# Patient Record
Sex: Female | Born: 1945 | Race: White | Hispanic: No | Smoking: Never smoker
Health system: Southern US, Community
[De-identification: ages and names within clinical notes are randomized; demographics above are authoritative.]

## PROBLEM LIST (undated history)

## (undated) DIAGNOSIS — M1990 Unspecified osteoarthritis, unspecified site: Secondary | ICD-10-CM

## (undated) DIAGNOSIS — E039 Hypothyroidism, unspecified: Secondary | ICD-10-CM

## (undated) DIAGNOSIS — F419 Anxiety disorder, unspecified: Secondary | ICD-10-CM

## (undated) DIAGNOSIS — E876 Hypokalemia: Secondary | ICD-10-CM

## (undated) DIAGNOSIS — F40240 Claustrophobia: Secondary | ICD-10-CM

## (undated) DIAGNOSIS — I10 Essential (primary) hypertension: Secondary | ICD-10-CM

## (undated) DIAGNOSIS — E785 Hyperlipidemia, unspecified: Secondary | ICD-10-CM

## (undated) HISTORY — PX: ABDOMINAL HYSTERECTOMY: SHX81

---

## 1975-01-20 HISTORY — PX: CYST REMOVAL TRUNK: SHX6283

## 1997-09-26 ENCOUNTER — Other Ambulatory Visit: Admission: RE | Admit: 1997-09-26 | Discharge: 1997-09-26 | Payer: Self-pay | Admitting: *Deleted

## 1998-09-12 ENCOUNTER — Other Ambulatory Visit: Admission: RE | Admit: 1998-09-12 | Discharge: 1998-09-12 | Payer: Self-pay | Admitting: *Deleted

## 2000-11-24 ENCOUNTER — Ambulatory Visit (HOSPITAL_COMMUNITY): Admission: RE | Admit: 2000-11-24 | Discharge: 2000-11-24 | Payer: Self-pay | Admitting: Gastroenterology

## 2001-10-10 ENCOUNTER — Encounter: Payer: Self-pay | Admitting: Family Medicine

## 2001-10-10 ENCOUNTER — Ambulatory Visit (HOSPITAL_COMMUNITY): Admission: RE | Admit: 2001-10-10 | Discharge: 2001-10-10 | Payer: Self-pay | Admitting: Family Medicine

## 2006-03-19 ENCOUNTER — Ambulatory Visit (HOSPITAL_COMMUNITY): Admission: RE | Admit: 2006-03-19 | Discharge: 2006-03-19 | Payer: Self-pay | Admitting: Family Medicine

## 2016-01-23 ENCOUNTER — Encounter (HOSPITAL_COMMUNITY): Payer: Self-pay

## 2016-01-23 ENCOUNTER — Encounter (HOSPITAL_COMMUNITY)
Admission: RE | Admit: 2016-01-23 | Discharge: 2016-01-23 | Disposition: A | Discharge status: Home / Self Care | Payer: Medicare HMO | Source: Ambulatory Visit | Attending: Ophthalmology | Admitting: Ophthalmology

## 2016-01-23 DIAGNOSIS — Z01818 Encounter for other preprocedural examination: Secondary | ICD-10-CM | POA: Diagnosis not present

## 2016-01-23 HISTORY — DX: Unspecified osteoarthritis, unspecified site: M19.90

## 2016-01-23 HISTORY — DX: Claustrophobia: F40.240

## 2016-01-23 HISTORY — DX: Hypothyroidism, unspecified: E03.9

## 2016-01-23 HISTORY — DX: Anxiety disorder, unspecified: F41.9

## 2016-01-23 HISTORY — DX: Essential (primary) hypertension: I10

## 2016-01-23 LAB — BASIC METABOLIC PANEL
ANION GAP: 11 (ref 5–15)
BUN: 23 mg/dL — ABNORMAL HIGH (ref 6–20)
CALCIUM: 9.3 mg/dL (ref 8.9–10.3)
CO2: 28 mmol/L (ref 22–32)
Chloride: 97 mmol/L — ABNORMAL LOW (ref 101–111)
Creatinine, Ser: 1.15 mg/dL — ABNORMAL HIGH (ref 0.44–1.00)
GFR calc non Af Amer: 47 mL/min — ABNORMAL LOW (ref >60)
GFR, EST AFRICAN AMERICAN: 54 mL/min — AB (ref >60)
GLUCOSE: 113 mg/dL — AB (ref 65–99)
POTASSIUM: 3.5 mmol/L (ref 3.5–5.1)
Sodium: 136 mmol/L (ref 135–145)

## 2016-01-23 LAB — CBC
HEMATOCRIT: 39.7 % (ref 36.0–46.0)
HEMOGLOBIN: 13.6 g/dL (ref 12.0–15.0)
MCH: 32.2 pg (ref 26.0–34.0)
MCHC: 34.3 g/dL (ref 30.0–36.0)
MCV: 93.9 fL (ref 78.0–100.0)
Platelets: 197 10*3/uL (ref 150–400)
RBC: 4.23 MIL/uL (ref 3.87–5.11)
RDW: 13.1 % (ref 11.5–15.5)
WBC: 5.5 10*3/uL (ref 4.0–10.5)

## 2016-01-23 NOTE — Patient Instructions (Signed)
Your procedure is scheduled on: 01/28/2016  Report to [facility] at   700  AM.  Call this number if you have problems the morning of surgery: [phone]   Do not eat food or drink liquids :After Midnight.      Take these medicines the morning of surgery with A SIP OF WATER: cartia XT, gabapentin, levothyroxine.   Do not wear jewelry, make-up or nail polish.  Do not wear lotions, powders, or perfumes. You may wear deodorant.  Do not shave 48 hours prior to surgery.  Do not bring valuables to the hospital.  Contacts, dentures or bridgework may not be worn into surgery.  Leave suitcase in the car. After surgery it may be brought to your room.  For patients admitted to the hospital, checkout time is 11:00 AM the day of discharge.   Patients discharged the day of surgery will not be allowed to drive home.  :     Please read over the following fact sheets that you were given: Coughing and Deep Breathing, Surgical Site Infection Prevention, Anesthesia Post-op Instructions and Care and Recovery After Surgery    Cataract A cataract is a clouding of the lens of the eye. When a lens becomes cloudy, vision is reduced based on the degree and nature of the clouding. Many cataracts reduce vision to some degree. Some cataracts make people more near-sighted as they develop. Other cataracts increase glare. Cataracts that are ignored and become worse can sometimes look white. The white color can be seen through the pupil. CAUSES   Aging. However, cataracts may occur at any age, even in newborns.   Certain drugs.   Trauma to the eye.   Certain diseases such as diabetes.   Specific eye diseases such as chronic inflammation inside the eye or a sudden attack of a rare form of glaucoma.   Inherited or acquired medical problems.  SYMPTOMS   Gradual, progressive drop in vision in the affected eye.   Severe, rapid visual loss. This most often happens when trauma is the cause.  DIAGNOSIS  To detect a  cataract, an eye doctor examines the lens. Cataracts are best diagnosed with an exam of the eyes with the pupils enlarged (dilated) by drops.  TREATMENT  For an early cataract, vision may improve by using different eyeglasses or stronger lighting. If that does not help your vision, surgery is the only effective treatment. A cataract needs to be surgically removed when vision loss interferes with your everyday activities, such as driving, reading, or watching TV. A cataract may also have to be removed if it prevents examination or treatment of another eye problem. Surgery removes the cloudy lens and usually replaces it with a substitute lens (intraocular lens, IOL).  At a time when both you and your doctor agree, the cataract will be surgically removed. If you have cataracts in both eyes, only one is usually removed at a time. This allows the operated eye to heal and be out of danger from any possible problems after surgery (such as infection or poor wound healing). In rare cases, a cataract may be doing damage to your eye. In these cases, your caregiver may advise surgical removal right away. The vast majority of people who have cataract surgery have better vision afterward. HOME CARE INSTRUCTIONS  If you are not planning surgery, you may be asked to do the following:  Use different eyeglasses.   Use stronger or brighter lighting.   Ask your eye doctor about reducing  your medicine dose or changing medicines if it is thought that a medicine caused your cataract. Changing medicines does not make the cataract go away on its own.   Become familiar with your surroundings. Poor vision can lead to injury. Avoid bumping into things on the affected side. You are at a higher risk for tripping or falling.   Exercise extreme care when driving or operating machinery.   Wear sunglasses if you are sensitive to bright light or experiencing problems with glare.  SEEK IMMEDIATE MEDICAL CARE IF:   You have a  worsening or sudden vision loss.   You notice redness, swelling, or increasing pain in the eye.   You have a fever.  Document Released: 01/05/2005 Document Revised: 12/25/2010 Document Reviewed: 08/29/2010 [facility] Patient Information 2012 [place].PATIENT INSTRUCTIONS POST-ANESTHESIA  IMMEDIATELY FOLLOWING SURGERY:  Do not drive or operate machinery for the first twenty four hours after surgery.  Do not make any important decisions for twenty four hours after surgery or while taking narcotic pain medications or sedatives.  If you develop intractable nausea and vomiting or a severe headache please notify your doctor immediately.  FOLLOW-UP:  Please make an appointment with your surgeon as instructed. You do not need to follow up with anesthesia unless specifically instructed to do so.  WOUND CARE INSTRUCTIONS (if applicable):  Keep a dry clean dressing on the anesthesia/puncture wound site if there is drainage.  Once the wound has quit draining you may leave it open to air.  Generally you should leave the bandage intact for twenty four hours unless there is drainage.  If the epidural site drains for more than 36-48 hours please call the anesthesia department.  QUESTIONS?:  Please feel free to call your physician or the hospital operator if you have any questions, and they will be happy to assist you.

## 2016-02-05 MED ORDER — PHENYLEPHRINE HCL 2.5 % OP SOLN: 1.0000 [drp] | OPHTHALMIC | Status: AC

## 2016-02-05 MED ORDER — CYCLOPENTOLATE-PHENYLEPHRINE 0.2-1 % OP SOLN: 1.0000 [drp] | OPHTHALMIC | Status: AC

## 2016-02-05 MED ORDER — TETRACAINE HCL 0.5 % OP SOLN: 1.0000 [drp] | OPHTHALMIC | Status: AC

## 2016-02-05 MED ORDER — KETOROLAC TROMETHAMINE 0.5 % OP SOLN: 1.0000 [drp] | OPHTHALMIC | Status: AC

## 2016-02-06 ENCOUNTER — Inpatient Hospital Stay (HOSPITAL_COMMUNITY): Admission: RE | Admit: 2016-02-06 | Payer: Self-pay | Source: Ambulatory Visit

## 2016-02-06 ENCOUNTER — Encounter (HOSPITAL_COMMUNITY)
Admission: RE | Admit: 2016-02-06 | Discharge: 2016-02-06 | Disposition: A | Discharge status: Home / Self Care | Payer: Medicare HMO | Source: Ambulatory Visit | Attending: Ophthalmology | Admitting: Ophthalmology

## 2016-02-06 ENCOUNTER — Encounter (HOSPITAL_COMMUNITY): Payer: Self-pay

## 2016-02-06 HISTORY — DX: Hyperlipidemia, unspecified: E78.5

## 2016-02-06 HISTORY — DX: Hypokalemia: E87.6

## 2016-02-11 ENCOUNTER — Ambulatory Visit (HOSPITAL_COMMUNITY)
Admission: RE | Admit: 2016-02-11 | Discharge: 2016-02-11 | Disposition: A | Discharge status: Home / Self Care | Payer: Medicare HMO | Source: Ambulatory Visit | Attending: Ophthalmology | Admitting: Ophthalmology

## 2016-02-11 ENCOUNTER — Encounter (HOSPITAL_COMMUNITY)
Admission: RE | Disposition: A | Discharge status: Home / Self Care | Payer: Self-pay | Source: Ambulatory Visit | Attending: Ophthalmology

## 2016-02-11 ENCOUNTER — Ambulatory Visit (HOSPITAL_COMMUNITY): Payer: Medicare HMO | Admitting: Anesthesiology

## 2016-02-11 DIAGNOSIS — Z79899 Other long term (current) drug therapy: Secondary | ICD-10-CM | POA: Diagnosis not present

## 2016-02-11 DIAGNOSIS — H269 Unspecified cataract: Secondary | ICD-10-CM | POA: Diagnosis not present

## 2016-02-11 DIAGNOSIS — I10 Essential (primary) hypertension: Secondary | ICD-10-CM | POA: Diagnosis not present

## 2016-02-11 DIAGNOSIS — E039 Hypothyroidism, unspecified: Secondary | ICD-10-CM | POA: Insufficient documentation

## 2016-02-11 DIAGNOSIS — Z7982 Long term (current) use of aspirin: Secondary | ICD-10-CM | POA: Diagnosis not present

## 2016-02-11 HISTORY — PX: CATARACT EXTRACTION W/PHACO: SHX586

## 2016-02-11 SURGERY — PHACOEMULSIFICATION, CATARACT, WITH IOL INSERTION
Anesthesia: Monitor Anesthesia Care | Site: Eye | Laterality: Left

## 2016-02-11 MED ORDER — KETOROLAC TROMETHAMINE 0.5 % OP SOLN
1.0000 [drp] | OPHTHALMIC | Status: AC
  Administered 2016-02-11 (×3): 1 [drp] via OPHTHALMIC

## 2016-02-11 MED ORDER — EPINEPHRINE PF 1 MG/ML IJ SOLN
INTRAMUSCULAR | Status: AC
  Filled 2016-02-11: qty 1

## 2016-02-11 MED ORDER — BSS IO SOLN
INTRAOCULAR | Status: DC | PRN
  Administered 2016-02-11: 15 mL

## 2016-02-11 MED ORDER — MIDAZOLAM HCL 2 MG/2ML IJ SOLN
0.5000 mg | INTRAMUSCULAR | Status: DC | PRN
  Administered 2016-02-11: 2 mg via INTRAVENOUS

## 2016-02-11 MED ORDER — PROVISC 10 MG/ML IO SOLN
INTRAOCULAR | Status: DC | PRN
  Administered 2016-02-11: 0.85 mL via INTRAOCULAR

## 2016-02-11 MED ORDER — PHENYLEPHRINE HCL 2.5 % OP SOLN
1.0000 [drp] | OPHTHALMIC | Status: AC
  Administered 2016-02-11 (×3): 1 [drp] via OPHTHALMIC

## 2016-02-11 MED ORDER — EPINEPHRINE PF 1 MG/ML IJ SOLN
INTRAOCULAR | Status: DC | PRN
  Administered 2016-02-11: 500 mL

## 2016-02-11 MED ORDER — LACTATED RINGERS IV SOLN
INTRAVENOUS | Status: DC
  Administered 2016-02-11: 1000 mL via INTRAVENOUS

## 2016-02-11 MED ORDER — FENTANYL CITRATE (PF) 100 MCG/2ML IJ SOLN
INTRAMUSCULAR | Status: AC
  Filled 2016-02-11: qty 2

## 2016-02-11 MED ORDER — TETRACAINE HCL 0.5 % OP SOLN
1.0000 [drp] | OPHTHALMIC | Status: AC
  Administered 2016-02-11 (×3): 1 [drp] via OPHTHALMIC

## 2016-02-11 MED ORDER — CYCLOPENTOLATE-PHENYLEPHRINE 0.2-1 % OP SOLN
1.0000 [drp] | OPHTHALMIC | Status: AC
  Administered 2016-02-11 (×3): 1 [drp] via OPHTHALMIC

## 2016-02-11 MED ORDER — MIDAZOLAM HCL 2 MG/2ML IJ SOLN
INTRAMUSCULAR | Status: AC
  Filled 2016-02-11: qty 2

## 2016-02-11 MED ORDER — FENTANYL CITRATE (PF) 100 MCG/2ML IJ SOLN
25.0000 ug | INTRAMUSCULAR | Status: AC | PRN
  Administered 2016-02-11 (×2): 25 ug via INTRAVENOUS

## 2016-02-11 MED ORDER — TETRACAINE 0.5 % OP SOLN OPTIME - NO CHARGE
OPHTHALMIC | Status: DC | PRN
  Administered 2016-02-11: 2 [drp] via OPHTHALMIC

## 2016-02-11 MED ORDER — MIDAZOLAM HCL 2 MG/2ML IJ SOLN
INTRAMUSCULAR | Status: DC | PRN
  Administered 2016-02-11 (×2): 1 mg via INTRAVENOUS

## 2016-02-11 SURGICAL SUPPLY — 10 items
CLOTH BEACON ORANGE TIMEOUT ST (SAFETY) ×2 IMPLANT
EYE SHIELD UNIVERSAL CLEAR (GAUZE/BANDAGES/DRESSINGS) ×2 IMPLANT
GLOVE BIO SURGEON STRL SZ 6.5 (GLOVE) ×2 IMPLANT
GLOVE BIO SURGEONS STRL SZ 6.5 (GLOVE) ×1
GLOVE EXAM NITRILE MD LF STRL (GLOVE) ×2 IMPLANT
LENS ALC ACRYL/TECN (Ophthalmic Related) ×3 IMPLANT
PAD ARMBOARD 7.5X6 YLW CONV (MISCELLANEOUS) ×2 IMPLANT
TAPE SURG TRANSPORE 1 IN (GAUZE/BANDAGES/DRESSINGS) ×1 IMPLANT
TAPE SURGICAL TRANSPORE 1 IN (GAUZE/BANDAGES/DRESSINGS) ×2
WATER STERILE IRR 250ML POUR (IV SOLUTION) ×2 IMPLANT

## 2016-02-11 NOTE — Anesthesia Preprocedure Evaluation (Signed)
Anesthesia Evaluation  Patient identified by MRN, date of birth, ID band Patient awake    Reviewed: Allergy & Precautions, NPO status , Patient's Chart, lab work & pertinent test results  Airway Mallampati: III  TM Distance: >3 FB     Dental  (+) Teeth Intact   Pulmonary neg pulmonary ROS,    breath sounds clear to auscultation       Cardiovascular hypertension, Pt. on medications  Rhythm:Regular Rate:Normal     Neuro/Psych    GI/Hepatic negative GI ROS,   Endo/Other  Hypothyroidism   Renal/GU      Musculoskeletal   Abdominal   Peds  Hematology   Anesthesia Other Findings   Reproductive/Obstetrics                             Anesthesia Physical Anesthesia Plan  ASA: II  Anesthesia Plan: MAC   Post-op Pain Management:    Induction: Intravenous  Airway Management Planned: Nasal Cannula  Additional Equipment:   Intra-op Plan:   Post-operative Plan:   Informed Consent: I have reviewed the patients History and Physical, chart, labs and discussed the procedure including the risks, benefits and alternatives for the proposed anesthesia with the patient or authorized representative who has indicated his/her understanding and acceptance.     Plan Discussed with:   Anesthesia Plan Comments:         Anesthesia Quick Evaluation

## 2016-02-11 NOTE — H&P (Signed)
The patient was re examined and there is no change in the patients condition since the original H and P. 

## 2016-02-11 NOTE — Anesthesia Procedure Notes (Signed)
Procedure Name: MAC Date/Time: 02/11/2016 9:05 AM Performed by: Vista Deck Pre-anesthesia Checklist: Patient identified, Emergency Drugs available, Suction available, Timeout performed and Patient being monitored Patient Re-evaluated:Patient Re-evaluated prior to inductionOxygen Delivery Method: Nasal Cannula

## 2016-02-11 NOTE — Transfer of Care (Signed)
Immediate Anesthesia Transfer of Care Note  Patient: Amber Randall  Procedure(s) Performed: Procedure(s) (LRB): CATARACT EXTRACTION PHACO AND INTRAOCULAR LENS PLACEMENT (IOC) (Left)  Patient Location: Shortstay  Anesthesia Type: MAC  Level of Consciousness: awake  Airway & Oxygen Therapy: Patient Spontanous Breathing   Post-op Assessment: Report given to PACU RN, Post -op Vital signs reviewed and stable and Patient moving all extremities  Post vital signs: Reviewed and stable  Complications: No apparent anesthesia complications

## 2016-02-11 NOTE — Anesthesia Postprocedure Evaluation (Signed)
Anesthesia Post Note  Patient: Amber Randall  Procedure(s) Performed: Procedure(s) (LRB): CATARACT EXTRACTION PHACO AND INTRAOCULAR LENS PLACEMENT (IOC) (Left)  Anesthesia Type: MAC     Last Vitals:  Vitals:   02/11/16 0850 02/11/16 0855  BP: (!) 103/58 (!) 104/57  Pulse:    Resp: 11 11  Temp:      Last Pain:  Vitals:   02/11/16 0755  TempSrc: Oral                 Anesthesia Post-op Note  Patient: [name]  Procedure(s) Performed: Procedure(s) (LRB): CATARACT EXTRACTION PHACO AND INTRAOCULAR LENS PLACEMENT (IOC) (Left)  Patient Location:  Short Stay  Anesthesia Type: MAC  Level of Consciousness: awake  Airway and Oxygen Therapy: Patient Spontanous Breathing  Post-op Pain: none  Post-op Assessment: Post-op Vital signs reviewed, Patient's Cardiovascular Status Stable, Respiratory Function Stable, Patent Airway, No signs of Nausea or vomiting and Pain level controlled  Post-op Vital Signs: Reviewed and stable  Complications: No apparent anesthesia complications  Charissa Knowles

## 2016-02-11 NOTE — Op Note (Signed)
Patient brought to the operating room and prepped and draped in the usual manner.  Lid speculum inserted in left eye.  Stab incision made at the twelve o'clock position.  Provisc instilled in the anterior chamber.   A 2.4 mm. Stab incision was made temporally.  An anterior capsulotomy was done with a bent 25 gauge needle.  The nucleus was hydrodissected.  The Phaco tip was inserted in the anterior chamber and the nucleus was emulsified.  CDE was 6.00.  The cortical material was then removed with the I and A tip.  Posterior capsule was the polished.  The anterior chamber was deepened with Provisc.  A 23.5 Diopter Alcon AU00T0 IOL was then inserted in the capsular bag.  Provisc was then removed with the I and A tip.  The wound was then hydrated.  Patient sent to the Recovery Room in good condition with follow up in my office.  Preoperative Diagnosis:  Nuclear Cataract OS Postoperative Diagnosis:  Same Procedure name: Kelman Phacoemulsification OS with IOL

## 2016-02-11 NOTE — Discharge Instructions (Signed)
°  °        [facility] Instructions °[address]- [place] [address] °    ° °1. Avoid closing eyes tightly. One often closes the eye tightly when laughing, talking, sneezing, coughing or if they feel irritated. At these times, you should be careful not to close your eyes tightly. ° °2. Instill eye drops as instructed. To instill drops in your eye, open it, look up and have someone gently pull the lower lid down and instill a couple of drops inside the lower lid. ° °3. Do not touch upper lid. ° °4. Take Advil or Tylenol for pain. ° °5. You may use either eye for near work, such as reading or sewing and you may watch television. ° °6. You may have your hair done at the beauty parlor at any time. ° °7. Wear dark glasses with or without your own glasses if you are in bright light. ° °8. Call our office at [phone] or [phone] if you have sharp pain in your eye or unusual symptoms. ° °9.  FOLLOW UP WITH DR. SHAPIRO TODAY IN HIS [place] OFFICE AT 2:45pm. ° °  °I have received a copy of the above instructions and will follow them.  ° ° ° °IF YOU ARE IN IMMEDIATE DANGER CALL 911! ° °It is important for you to keep your follow-up appointment with your physician after discharge, OR, for you /your caregiver to make a follow-up appointment with your physician / medical provider after discharge. ° °Show these instructions to the next healthcare provider you see. ° ° °Moderate Conscious Sedation, Adult, Care After °These instructions provide you with information about caring for yourself after your procedure. Your health care provider may also give you more specific instructions. Your treatment has been planned according to current medical practices, but problems sometimes occur. Call your health care provider if you have any problems or questions after your procedure. °What can I expect after the procedure? °After your procedure, it is common: °· To feel sleepy for several  hours. °· To feel clumsy and have poor balance for several hours. °· To have poor judgment for several hours. °· To vomit if you eat too soon. °Follow these instructions at home: °For at least 24 hours after the procedure:  ° °· Do not: °¨ Participate in activities where you could fall or become injured. °¨ Drive. °¨ Use heavy machinery. °¨ Drink alcohol. °¨ Take sleeping pills or medicines that cause drowsiness. °¨ Make important decisions or sign legal documents. °¨ Take care of children on your own. °· Rest. °Eating and drinking  °· Follow the diet recommended by your health care provider. °· If you vomit: °¨ Drink water, juice, or soup when you can drink without vomiting. °¨ Make sure you have little or no nausea before eating solid foods. °General instructions  °· Have a responsible adult stay with you until you are awake and alert. °· Take over-the-counter and prescription medicines only as told by your health care provider. °· If you smoke, do not smoke without supervision. °· Keep all follow-up visits as told by your health care provider. This is important. °Contact a health care provider if: °· You keep feeling nauseous or you keep vomiting. °· You feel light-headed. °· You develop a rash. °· You have a fever. °Get help right away if: °· You have trouble breathing. °This information is not intended to replace advice given to you by your health care provider. Make   any questions you have with your health care provider. °Document Released: 10/26/2012 Document Revised: 06/10/2015 Document Reviewed: 04/27/2015 °Elsevier Interactive Patient Education © 2017 [employer] ° ° °

## 2016-02-12 ENCOUNTER — Encounter (HOSPITAL_COMMUNITY): Payer: Self-pay | Admitting: Ophthalmology

## 2016-02-28 ENCOUNTER — Encounter (HOSPITAL_COMMUNITY): Payer: Self-pay

## 2016-02-28 ENCOUNTER — Encounter (HOSPITAL_COMMUNITY)
Admission: RE | Admit: 2016-02-28 | Discharge: 2016-02-28 | Disposition: A | Discharge status: Home / Self Care | Payer: Medicare HMO | Source: Ambulatory Visit | Attending: Ophthalmology | Admitting: Ophthalmology

## 2016-03-03 ENCOUNTER — Ambulatory Visit (HOSPITAL_COMMUNITY)
Admission: RE | Admit: 2016-03-03 | Discharge: 2016-03-03 | Disposition: A | Discharge status: Home / Self Care | Payer: Medicare HMO | Source: Ambulatory Visit | Attending: Ophthalmology | Admitting: Ophthalmology

## 2016-03-03 ENCOUNTER — Ambulatory Visit (HOSPITAL_COMMUNITY): Payer: Medicare HMO | Admitting: Anesthesiology

## 2016-03-03 ENCOUNTER — Encounter (HOSPITAL_COMMUNITY)
Admission: RE | Disposition: A | Discharge status: Home / Self Care | Payer: Self-pay | Source: Ambulatory Visit | Attending: Ophthalmology

## 2016-03-03 ENCOUNTER — Encounter (HOSPITAL_COMMUNITY): Payer: Self-pay | Admitting: Anesthesiology

## 2016-03-03 DIAGNOSIS — E039 Hypothyroidism, unspecified: Secondary | ICD-10-CM | POA: Insufficient documentation

## 2016-03-03 DIAGNOSIS — H2511 Age-related nuclear cataract, right eye: Secondary | ICD-10-CM | POA: Insufficient documentation

## 2016-03-03 DIAGNOSIS — Z79899 Other long term (current) drug therapy: Secondary | ICD-10-CM | POA: Insufficient documentation

## 2016-03-03 DIAGNOSIS — I10 Essential (primary) hypertension: Secondary | ICD-10-CM | POA: Diagnosis not present

## 2016-03-03 HISTORY — PX: CATARACT EXTRACTION W/PHACO: SHX586

## 2016-03-03 SURGERY — PHACOEMULSIFICATION, CATARACT, WITH IOL INSERTION
Anesthesia: Monitor Anesthesia Care | Site: Eye | Laterality: Right

## 2016-03-03 MED ORDER — BSS IO SOLN
INTRAOCULAR | Status: DC | PRN
  Administered 2016-03-03: 15 mL

## 2016-03-03 MED ORDER — FENTANYL CITRATE (PF) 100 MCG/2ML IJ SOLN
25.0000 ug | INTRAMUSCULAR | Status: AC
  Administered 2016-03-03: 25 ug via INTRAVENOUS

## 2016-03-03 MED ORDER — PROVISC 10 MG/ML IO SOLN
INTRAOCULAR | Status: DC | PRN
  Administered 2016-03-03: 0.85 mL via INTRAOCULAR

## 2016-03-03 MED ORDER — MIDAZOLAM HCL 2 MG/2ML IJ SOLN
INTRAMUSCULAR | Status: AC
  Filled 2016-03-03: qty 2

## 2016-03-03 MED ORDER — TETRACAINE HCL 0.5 % OP SOLN
1.0000 [drp] | OPHTHALMIC | Status: AC
  Administered 2016-03-03 (×3): 1 [drp] via OPHTHALMIC

## 2016-03-03 MED ORDER — KETOROLAC TROMETHAMINE 0.5 % OP SOLN
1.0000 [drp] | OPHTHALMIC | Status: AC
Start: 2016-03-03 — End: 2016-03-03
  Administered 2016-03-03 (×3): 1 [drp] via OPHTHALMIC

## 2016-03-03 MED ORDER — MIDAZOLAM HCL 2 MG/2ML IJ SOLN
1.0000 mg | INTRAMUSCULAR | Status: AC
  Administered 2016-03-03: 2 mg via INTRAVENOUS

## 2016-03-03 MED ORDER — EPINEPHRINE PF 1 MG/ML IJ SOLN
INTRAMUSCULAR | Status: AC
  Filled 2016-03-03: qty 1

## 2016-03-03 MED ORDER — TETRACAINE 0.5 % OP SOLN OPTIME - NO CHARGE
OPHTHALMIC | Status: DC | PRN
  Administered 2016-03-03: 2 [drp] via OPHTHALMIC

## 2016-03-03 MED ORDER — PHENYLEPHRINE HCL 2.5 % OP SOLN
1.0000 [drp] | OPHTHALMIC | Status: AC
  Administered 2016-03-03 (×3): 1 [drp] via OPHTHALMIC

## 2016-03-03 MED ORDER — CYCLOPENTOLATE-PHENYLEPHRINE 0.2-1 % OP SOLN
1.0000 [drp] | OPHTHALMIC | Status: AC
  Administered 2016-03-03 (×2): 1 [drp] via OPHTHALMIC

## 2016-03-03 MED ORDER — LACTATED RINGERS IV SOLN
INTRAVENOUS | Status: DC
  Administered 2016-03-03: 10:00:00 via INTRAVENOUS

## 2016-03-03 MED ORDER — EPINEPHRINE PF 1 MG/ML IJ SOLN
INTRAMUSCULAR | Status: DC | PRN
  Administered 2016-03-03: 500 mL

## 2016-03-03 MED ORDER — FENTANYL CITRATE (PF) 100 MCG/2ML IJ SOLN
INTRAMUSCULAR | Status: AC
  Filled 2016-03-03: qty 2

## 2016-03-03 SURGICAL SUPPLY — 10 items
CLOTH BEACON ORANGE TIMEOUT ST (SAFETY) ×1 IMPLANT
EYE SHIELD UNIVERSAL CLEAR (GAUZE/BANDAGES/DRESSINGS) ×1 IMPLANT
GLOVE BIO SURGEON STRL SZ 6.5 (GLOVE) ×1 IMPLANT
GLOVE BIOGEL PI IND STRL 7.0 (GLOVE) IMPLANT
GLOVE BIOGEL PI INDICATOR 7.0 (GLOVE) ×1
LENS ALC ACRYL/TECN (Ophthalmic Related) ×2 IMPLANT
PAD ARMBOARD 7.5X6 YLW CONV (MISCELLANEOUS) ×2 IMPLANT
TAPE SURG TRANSPORE 1 IN (GAUZE/BANDAGES/DRESSINGS) IMPLANT
TAPE SURGICAL TRANSPORE 1 IN (GAUZE/BANDAGES/DRESSINGS) ×1
WATER STERILE IRR 250ML POUR (IV SOLUTION) ×1 IMPLANT

## 2016-03-03 NOTE — Anesthesia Preprocedure Evaluation (Signed)
Anesthesia Evaluation  Patient identified by MRN, date of birth, ID band Patient awake    Reviewed: Allergy & Precautions, NPO status , Patient's Chart, lab work & pertinent test results  Airway Mallampati: III  TM Distance: >3 FB     Dental  (+) Teeth Intact   Pulmonary neg pulmonary ROS,    breath sounds clear to auscultation       Cardiovascular hypertension, Pt. on medications  Rhythm:Regular Rate:Normal     Neuro/Psych    GI/Hepatic negative GI ROS,   Endo/Other  Hypothyroidism   Renal/GU      Musculoskeletal   Abdominal   Peds  Hematology   Anesthesia Other Findings   Reproductive/Obstetrics                             Anesthesia Physical Anesthesia Plan  ASA: II  Anesthesia Plan: MAC   Post-op Pain Management:    Induction: Intravenous  Airway Management Planned: Nasal Cannula  Additional Equipment:   Intra-op Plan:   Post-operative Plan:   Informed Consent: I have reviewed the patients History and Physical, chart, labs and discussed the procedure including the risks, benefits and alternatives for the proposed anesthesia with the patient or authorized representative who has indicated his/her understanding and acceptance.     Plan Discussed with:   Anesthesia Plan Comments:         Anesthesia Quick Evaluation  

## 2016-03-03 NOTE — Anesthesia Procedure Notes (Signed)
Procedure Name: MAC Date/Time: 03/03/2016 10:02 AM Performed by: Andree Elk, AMY A Pre-anesthesia Checklist: Patient identified, Timeout performed, Emergency Drugs available and Suction available Oxygen Delivery Method: Simple face mask

## 2016-03-03 NOTE — Transfer of Care (Signed)
Immediate Anesthesia Transfer of Care Note  Patient: Amber Randall  Procedure(s) Performed: Procedure(s) with comments: CATARACT EXTRACTION PHACO AND INTRAOCULAR LENS PLACEMENT (IOC) (Right) - CDE: 7.67  Patient Location: PACU  Anesthesia Type:MAC  Level of Consciousness: awake, alert , oriented and patient cooperative  Airway & Oxygen Therapy: Patient Spontanous Breathing  Post-op Assessment: Report given to RN and Post -op Vital signs reviewed and stable  Post vital signs: Reviewed and stable  Last Vitals:  Vitals:   03/03/16 0955 03/03/16 0956  BP:  110/74  Pulse:    Resp: 18 (!) 24  Temp:      Last Pain:  Vitals:   03/03/16 0851  TempSrc: Oral      Patients Stated Pain Goal: 4 (67/89/38 1017)  Complications: No apparent anesthesia complications

## 2016-03-03 NOTE — H&P (Signed)
The patient was re examined and there is no change in the patients condition since the original H and P. 

## 2016-03-03 NOTE — Discharge Instructions (Addendum)
PATIENT INSTRUCTIONS °POST-ANESTHESIA ° °IMMEDIATELY FOLLOWING SURGERY:  Do not drive or operate machinery for the first twenty four hours after surgery.  Do not make any important decisions for twenty four hours after surgery or while taking narcotic pain medications or sedatives.  If you develop intractable nausea and vomiting or a severe headache please notify your doctor immediately. ° °FOLLOW-UP:  Please make an appointment with your surgeon as instructed. You do not need to follow up with anesthesia unless specifically instructed to do so. ° °WOUND CARE INSTRUCTIONS (if applicable):  Keep a dry clean dressing on the anesthesia/puncture wound site if there is drainage.  Once the wound has quit draining you may leave it open to air.  Generally you should leave the bandage intact for twenty four hours unless there is drainage.  If the epidural site drains for more than 36-48 hours please call the anesthesia department. ° °QUESTIONS?:  Please feel free to call your physician or the hospital operator if you have any questions, and they will be happy to assist you.    ° ° °  °  °        [facility] Instructions °[address]- [place] [address] °    ° °1. Avoid closing eyes tightly. One often closes the eye tightly when laughing, talking, sneezing, coughing or if they feel irritated. At these times, you should be careful not to close your eyes tightly. ° °2. Instill eye drops as instructed. To instill drops in your eye, open it, look up and have someone gently pull the lower lid down and instill a couple of drops inside the lower lid. ° °3. Do not touch upper lid. ° °4. Take Advil or Tylenol for pain. ° °5. You may use either eye for near work, such as reading or sewing and you may watch television. ° °6. You may have your hair done at the beauty parlor at any time. ° °7. Wear dark glasses with or without your own glasses if you are in bright light. ° °8. Call our office at  [phone] or [phone] if you have sharp pain in your eye or unusual symptoms. ° °9.  FOLLOW UP WITH DR. SHAPIRO TODAY IN HIS [place] OFFICE AT 2:45pm. ° °  °I have received a copy of the above instructions and will follow them.  ° ° ° °IF YOU ARE IN IMMEDIATE DANGER CALL 911! ° °It is important for you to keep your follow-up appointment with your physician after discharge, OR, for you /your caregiver to make a follow-up appointment with your physician / medical provider after discharge. ° °Show these instructions to the next healthcare provider you see. ° ° ° °

## 2016-03-03 NOTE — Anesthesia Postprocedure Evaluation (Signed)
Anesthesia Post Note  Patient: Amber Randall  Procedure(s) Performed: Procedure(s) (LRB): CATARACT EXTRACTION PHACO AND INTRAOCULAR LENS PLACEMENT (IOC) (Right)  Patient location during evaluation: Short Stay Anesthesia Type: MAC Level of consciousness: awake and alert and oriented Pain management: pain level controlled Vital Signs Assessment: post-procedure vital signs reviewed and stable Respiratory status: spontaneous breathing Anesthetic complications: no     Last Vitals:  Vitals:   03/03/16 0955 03/03/16 0956  BP:  110/74  Pulse:    Resp: 18 (!) 24  Temp:      Last Pain:  Vitals:   03/03/16 0851  TempSrc: Oral                 Jazsmine Macari A

## 2016-03-03 NOTE — Op Note (Signed)
Patient brought to the operating room and prepped and draped in the usual manner.  Lid speculum inserted in right eye.  Stab incision made at the twelve o'clock position.  Provisc instilled in the anterior chamber.   A 2.4 mm. Stab incision was made temporally.  An anterior capsulotomy was done with a bent 25 gauge needle.  The nucleus was hydrodissected.  The Phaco tip was inserted in the anterior chamber and the nucleus was emulsified.  CDE was 7.67.  The cortical material was then removed with the I and A tip.  Posterior capsule was the polished.  The anterior chamber was deepened with Provisc.  A 21.0 Diopter Alcon AU00T0 IOL was then inserted in the capsular bag.  Provisc was then removed with the I and A tip.  The wound was then hydrated.  Patient sent to the Recovery Room in good condition with follow up in my office.  Preoperative Diagnosis:  Nuclear Cataract OD Postoperative Diagnosis:  Same Procedure name: Kelman Phacoemulsification OD with IOL

## 2016-03-04 ENCOUNTER — Encounter (HOSPITAL_COMMUNITY): Payer: Self-pay | Admitting: Ophthalmology

## 2017-07-05 DIAGNOSIS — I739 Peripheral vascular disease, unspecified: Secondary | ICD-10-CM | POA: Diagnosis not present

## 2017-07-05 DIAGNOSIS — E1165 Type 2 diabetes mellitus with hyperglycemia: Secondary | ICD-10-CM | POA: Diagnosis not present

## 2017-07-05 DIAGNOSIS — E1142 Type 2 diabetes mellitus with diabetic polyneuropathy: Secondary | ICD-10-CM | POA: Diagnosis not present

## 2017-07-05 DIAGNOSIS — Z299 Encounter for prophylactic measures, unspecified: Secondary | ICD-10-CM | POA: Diagnosis not present

## 2017-07-05 DIAGNOSIS — I10 Essential (primary) hypertension: Secondary | ICD-10-CM | POA: Diagnosis not present

## 2017-07-05 DIAGNOSIS — E039 Hypothyroidism, unspecified: Secondary | ICD-10-CM | POA: Diagnosis not present

## 2017-07-05 DIAGNOSIS — L4050 Arthropathic psoriasis, unspecified: Secondary | ICD-10-CM | POA: Diagnosis not present

## 2017-07-05 DIAGNOSIS — Z6830 Body mass index (BMI) 30.0-30.9, adult: Secondary | ICD-10-CM | POA: Diagnosis not present

## 2017-09-06 DIAGNOSIS — E7800 Pure hypercholesterolemia, unspecified: Secondary | ICD-10-CM | POA: Diagnosis not present

## 2017-09-06 DIAGNOSIS — I10 Essential (primary) hypertension: Secondary | ICD-10-CM | POA: Diagnosis not present

## 2017-10-07 DIAGNOSIS — E7800 Pure hypercholesterolemia, unspecified: Secondary | ICD-10-CM | POA: Diagnosis not present

## 2017-10-07 DIAGNOSIS — I10 Essential (primary) hypertension: Secondary | ICD-10-CM | POA: Diagnosis not present

## 2017-10-11 DIAGNOSIS — I10 Essential (primary) hypertension: Secondary | ICD-10-CM | POA: Diagnosis not present

## 2017-10-11 DIAGNOSIS — E1165 Type 2 diabetes mellitus with hyperglycemia: Secondary | ICD-10-CM | POA: Diagnosis not present

## 2017-10-11 DIAGNOSIS — E1142 Type 2 diabetes mellitus with diabetic polyneuropathy: Secondary | ICD-10-CM | POA: Diagnosis not present

## 2017-10-11 DIAGNOSIS — Z23 Encounter for immunization: Secondary | ICD-10-CM | POA: Diagnosis not present

## 2017-10-11 DIAGNOSIS — L4050 Arthropathic psoriasis, unspecified: Secondary | ICD-10-CM | POA: Diagnosis not present

## 2017-10-11 DIAGNOSIS — I739 Peripheral vascular disease, unspecified: Secondary | ICD-10-CM | POA: Diagnosis not present

## 2017-10-11 DIAGNOSIS — Z6830 Body mass index (BMI) 30.0-30.9, adult: Secondary | ICD-10-CM | POA: Diagnosis not present

## 2017-10-11 DIAGNOSIS — Z299 Encounter for prophylactic measures, unspecified: Secondary | ICD-10-CM | POA: Diagnosis not present

## 2017-11-03 DIAGNOSIS — I10 Essential (primary) hypertension: Secondary | ICD-10-CM | POA: Diagnosis not present

## 2017-11-03 DIAGNOSIS — E7800 Pure hypercholesterolemia, unspecified: Secondary | ICD-10-CM | POA: Diagnosis not present

## 2018-01-17 DIAGNOSIS — M47816 Spondylosis without myelopathy or radiculopathy, lumbar region: Secondary | ICD-10-CM | POA: Diagnosis not present

## 2018-01-17 DIAGNOSIS — Z6830 Body mass index (BMI) 30.0-30.9, adult: Secondary | ICD-10-CM | POA: Diagnosis not present

## 2018-01-17 DIAGNOSIS — R05 Cough: Secondary | ICD-10-CM | POA: Diagnosis not present

## 2018-01-17 DIAGNOSIS — E1165 Type 2 diabetes mellitus with hyperglycemia: Secondary | ICD-10-CM | POA: Diagnosis not present

## 2018-01-17 DIAGNOSIS — I10 Essential (primary) hypertension: Secondary | ICD-10-CM | POA: Diagnosis not present

## 2018-01-17 DIAGNOSIS — Z299 Encounter for prophylactic measures, unspecified: Secondary | ICD-10-CM | POA: Diagnosis not present

## 2018-01-17 DIAGNOSIS — M5136 Other intervertebral disc degeneration, lumbar region: Secondary | ICD-10-CM | POA: Diagnosis not present

## 2018-01-17 DIAGNOSIS — M48061 Spinal stenosis, lumbar region without neurogenic claudication: Secondary | ICD-10-CM | POA: Diagnosis not present

## 2018-01-17 DIAGNOSIS — M549 Dorsalgia, unspecified: Secondary | ICD-10-CM | POA: Diagnosis not present

## 2018-01-17 DIAGNOSIS — M47819 Spondylosis without myelopathy or radiculopathy, site unspecified: Secondary | ICD-10-CM | POA: Diagnosis not present

## 2018-01-17 DIAGNOSIS — L989 Disorder of the skin and subcutaneous tissue, unspecified: Secondary | ICD-10-CM | POA: Diagnosis not present

## 2018-01-26 DIAGNOSIS — I10 Essential (primary) hypertension: Secondary | ICD-10-CM | POA: Diagnosis not present

## 2018-01-26 DIAGNOSIS — E7800 Pure hypercholesterolemia, unspecified: Secondary | ICD-10-CM | POA: Diagnosis not present

## 2018-02-01 DIAGNOSIS — E1165 Type 2 diabetes mellitus with hyperglycemia: Secondary | ICD-10-CM | POA: Diagnosis not present

## 2018-02-01 DIAGNOSIS — Z789 Other specified health status: Secondary | ICD-10-CM | POA: Diagnosis not present

## 2018-02-01 DIAGNOSIS — Z6830 Body mass index (BMI) 30.0-30.9, adult: Secondary | ICD-10-CM | POA: Diagnosis not present

## 2018-02-01 DIAGNOSIS — R109 Unspecified abdominal pain: Secondary | ICD-10-CM | POA: Diagnosis not present

## 2018-02-01 DIAGNOSIS — Z299 Encounter for prophylactic measures, unspecified: Secondary | ICD-10-CM | POA: Diagnosis not present

## 2018-02-01 DIAGNOSIS — I10 Essential (primary) hypertension: Secondary | ICD-10-CM | POA: Diagnosis not present

## 2018-02-01 DIAGNOSIS — E039 Hypothyroidism, unspecified: Secondary | ICD-10-CM | POA: Diagnosis not present

## 2018-02-24 DIAGNOSIS — E7800 Pure hypercholesterolemia, unspecified: Secondary | ICD-10-CM | POA: Diagnosis not present

## 2018-02-24 DIAGNOSIS — I10 Essential (primary) hypertension: Secondary | ICD-10-CM | POA: Diagnosis not present

## 2018-04-28 DIAGNOSIS — I10 Essential (primary) hypertension: Secondary | ICD-10-CM | POA: Diagnosis not present

## 2018-04-28 DIAGNOSIS — E7800 Pure hypercholesterolemia, unspecified: Secondary | ICD-10-CM | POA: Diagnosis not present

## 2018-05-23 DIAGNOSIS — R5383 Other fatigue: Secondary | ICD-10-CM | POA: Diagnosis not present

## 2018-05-23 DIAGNOSIS — Z7189 Other specified counseling: Secondary | ICD-10-CM | POA: Diagnosis not present

## 2018-05-23 DIAGNOSIS — E1165 Type 2 diabetes mellitus with hyperglycemia: Secondary | ICD-10-CM | POA: Diagnosis not present

## 2018-05-23 DIAGNOSIS — Z1331 Encounter for screening for depression: Secondary | ICD-10-CM | POA: Diagnosis not present

## 2018-05-23 DIAGNOSIS — Z1339 Encounter for screening examination for other mental health and behavioral disorders: Secondary | ICD-10-CM | POA: Diagnosis not present

## 2018-05-23 DIAGNOSIS — I10 Essential (primary) hypertension: Secondary | ICD-10-CM | POA: Diagnosis not present

## 2018-05-23 DIAGNOSIS — E039 Hypothyroidism, unspecified: Secondary | ICD-10-CM | POA: Diagnosis not present

## 2018-05-23 DIAGNOSIS — E7800 Pure hypercholesterolemia, unspecified: Secondary | ICD-10-CM | POA: Diagnosis not present

## 2018-05-23 DIAGNOSIS — E559 Vitamin D deficiency, unspecified: Secondary | ICD-10-CM | POA: Diagnosis not present

## 2018-05-23 DIAGNOSIS — Z79899 Other long term (current) drug therapy: Secondary | ICD-10-CM | POA: Diagnosis not present

## 2018-05-23 DIAGNOSIS — Z1211 Encounter for screening for malignant neoplasm of colon: Secondary | ICD-10-CM | POA: Diagnosis not present

## 2018-05-23 DIAGNOSIS — F329 Major depressive disorder, single episode, unspecified: Secondary | ICD-10-CM | POA: Diagnosis not present

## 2018-05-23 DIAGNOSIS — Z0000 Encounter for general adult medical examination without abnormal findings: Secondary | ICD-10-CM | POA: Diagnosis not present

## 2018-05-27 DIAGNOSIS — E7800 Pure hypercholesterolemia, unspecified: Secondary | ICD-10-CM | POA: Diagnosis not present

## 2018-05-27 DIAGNOSIS — I10 Essential (primary) hypertension: Secondary | ICD-10-CM | POA: Diagnosis not present

## 2018-06-18 DIAGNOSIS — E119 Type 2 diabetes mellitus without complications: Secondary | ICD-10-CM | POA: Diagnosis not present

## 2018-06-18 DIAGNOSIS — E039 Hypothyroidism, unspecified: Secondary | ICD-10-CM | POA: Diagnosis not present

## 2018-06-18 DIAGNOSIS — E785 Hyperlipidemia, unspecified: Secondary | ICD-10-CM | POA: Diagnosis not present

## 2018-06-18 DIAGNOSIS — Z7982 Long term (current) use of aspirin: Secondary | ICD-10-CM | POA: Diagnosis not present

## 2018-06-18 DIAGNOSIS — N390 Urinary tract infection, site not specified: Secondary | ICD-10-CM | POA: Diagnosis not present

## 2018-06-18 DIAGNOSIS — N3000 Acute cystitis without hematuria: Secondary | ICD-10-CM | POA: Diagnosis not present

## 2018-06-18 DIAGNOSIS — Z79899 Other long term (current) drug therapy: Secondary | ICD-10-CM | POA: Diagnosis not present

## 2018-06-18 DIAGNOSIS — E7800 Pure hypercholesterolemia, unspecified: Secondary | ICD-10-CM | POA: Diagnosis not present

## 2018-06-18 DIAGNOSIS — I10 Essential (primary) hypertension: Secondary | ICD-10-CM | POA: Diagnosis not present

## 2018-06-18 DIAGNOSIS — K5730 Diverticulosis of large intestine without perforation or abscess without bleeding: Secondary | ICD-10-CM | POA: Diagnosis not present

## 2018-06-18 DIAGNOSIS — R1032 Left lower quadrant pain: Secondary | ICD-10-CM | POA: Diagnosis not present

## 2018-06-27 DIAGNOSIS — I10 Essential (primary) hypertension: Secondary | ICD-10-CM | POA: Diagnosis not present

## 2018-06-27 DIAGNOSIS — E1165 Type 2 diabetes mellitus with hyperglycemia: Secondary | ICD-10-CM | POA: Diagnosis not present

## 2018-06-27 DIAGNOSIS — E1142 Type 2 diabetes mellitus with diabetic polyneuropathy: Secondary | ICD-10-CM | POA: Diagnosis not present

## 2018-06-27 DIAGNOSIS — M25552 Pain in left hip: Secondary | ICD-10-CM | POA: Diagnosis not present

## 2018-06-27 DIAGNOSIS — Z6829 Body mass index (BMI) 29.0-29.9, adult: Secondary | ICD-10-CM | POA: Diagnosis not present

## 2018-06-27 DIAGNOSIS — Z299 Encounter for prophylactic measures, unspecified: Secondary | ICD-10-CM | POA: Diagnosis not present

## 2018-06-27 DIAGNOSIS — F329 Major depressive disorder, single episode, unspecified: Secondary | ICD-10-CM | POA: Diagnosis not present

## 2018-06-27 DIAGNOSIS — G8929 Other chronic pain: Secondary | ICD-10-CM | POA: Diagnosis not present

## 2018-06-27 DIAGNOSIS — M1612 Unilateral primary osteoarthritis, left hip: Secondary | ICD-10-CM | POA: Diagnosis not present

## 2018-07-11 DIAGNOSIS — M25552 Pain in left hip: Secondary | ICD-10-CM | POA: Diagnosis not present

## 2018-07-11 DIAGNOSIS — Z299 Encounter for prophylactic measures, unspecified: Secondary | ICD-10-CM | POA: Diagnosis not present

## 2018-07-11 DIAGNOSIS — I10 Essential (primary) hypertension: Secondary | ICD-10-CM | POA: Diagnosis not present

## 2018-07-11 DIAGNOSIS — E1165 Type 2 diabetes mellitus with hyperglycemia: Secondary | ICD-10-CM | POA: Diagnosis not present

## 2018-07-11 DIAGNOSIS — Z6829 Body mass index (BMI) 29.0-29.9, adult: Secondary | ICD-10-CM | POA: Diagnosis not present

## 2018-07-11 DIAGNOSIS — E1142 Type 2 diabetes mellitus with diabetic polyneuropathy: Secondary | ICD-10-CM | POA: Diagnosis not present

## 2018-07-11 DIAGNOSIS — I739 Peripheral vascular disease, unspecified: Secondary | ICD-10-CM | POA: Diagnosis not present

## 2018-07-19 DIAGNOSIS — E7800 Pure hypercholesterolemia, unspecified: Secondary | ICD-10-CM | POA: Diagnosis not present

## 2018-07-19 DIAGNOSIS — I10 Essential (primary) hypertension: Secondary | ICD-10-CM | POA: Diagnosis not present

## 2018-08-29 DIAGNOSIS — M79606 Pain in leg, unspecified: Secondary | ICD-10-CM | POA: Diagnosis not present

## 2018-08-29 DIAGNOSIS — Z789 Other specified health status: Secondary | ICD-10-CM | POA: Diagnosis not present

## 2018-08-29 DIAGNOSIS — E1142 Type 2 diabetes mellitus with diabetic polyneuropathy: Secondary | ICD-10-CM | POA: Diagnosis not present

## 2018-08-29 DIAGNOSIS — I739 Peripheral vascular disease, unspecified: Secondary | ICD-10-CM | POA: Diagnosis not present

## 2018-08-29 DIAGNOSIS — Z6831 Body mass index (BMI) 31.0-31.9, adult: Secondary | ICD-10-CM | POA: Diagnosis not present

## 2018-08-29 DIAGNOSIS — E1165 Type 2 diabetes mellitus with hyperglycemia: Secondary | ICD-10-CM | POA: Diagnosis not present

## 2018-08-29 DIAGNOSIS — Z299 Encounter for prophylactic measures, unspecified: Secondary | ICD-10-CM | POA: Diagnosis not present

## 2018-08-29 DIAGNOSIS — I10 Essential (primary) hypertension: Secondary | ICD-10-CM | POA: Diagnosis not present

## 2018-08-30 DIAGNOSIS — M79604 Pain in right leg: Secondary | ICD-10-CM | POA: Diagnosis not present

## 2018-08-30 DIAGNOSIS — E1140 Type 2 diabetes mellitus with diabetic neuropathy, unspecified: Secondary | ICD-10-CM | POA: Diagnosis not present

## 2018-08-30 DIAGNOSIS — M79605 Pain in left leg: Secondary | ICD-10-CM | POA: Diagnosis not present

## 2018-08-30 DIAGNOSIS — E1159 Type 2 diabetes mellitus with other circulatory complications: Secondary | ICD-10-CM | POA: Diagnosis not present

## 2018-09-08 DIAGNOSIS — Z1231 Encounter for screening mammogram for malignant neoplasm of breast: Secondary | ICD-10-CM | POA: Diagnosis not present

## 2018-09-08 DIAGNOSIS — E039 Hypothyroidism, unspecified: Secondary | ICD-10-CM | POA: Diagnosis not present

## 2018-09-13 DIAGNOSIS — B9689 Other specified bacterial agents as the cause of diseases classified elsewhere: Secondary | ICD-10-CM | POA: Diagnosis not present

## 2018-09-13 DIAGNOSIS — E1142 Type 2 diabetes mellitus with diabetic polyneuropathy: Secondary | ICD-10-CM | POA: Diagnosis not present

## 2018-09-13 DIAGNOSIS — N760 Acute vaginitis: Secondary | ICD-10-CM | POA: Diagnosis not present

## 2018-09-13 DIAGNOSIS — E1165 Type 2 diabetes mellitus with hyperglycemia: Secondary | ICD-10-CM | POA: Diagnosis not present

## 2018-09-13 DIAGNOSIS — Z6831 Body mass index (BMI) 31.0-31.9, adult: Secondary | ICD-10-CM | POA: Diagnosis not present

## 2018-09-13 DIAGNOSIS — I10 Essential (primary) hypertension: Secondary | ICD-10-CM | POA: Diagnosis not present

## 2018-09-13 DIAGNOSIS — Z299 Encounter for prophylactic measures, unspecified: Secondary | ICD-10-CM | POA: Diagnosis not present

## 2019-03-14 DIAGNOSIS — E1129 Type 2 diabetes mellitus with other diabetic kidney complication: Secondary | ICD-10-CM | POA: Diagnosis not present

## 2019-03-14 DIAGNOSIS — Z789 Other specified health status: Secondary | ICD-10-CM | POA: Diagnosis not present

## 2019-03-14 DIAGNOSIS — I10 Essential (primary) hypertension: Secondary | ICD-10-CM | POA: Diagnosis not present

## 2019-03-14 DIAGNOSIS — R809 Proteinuria, unspecified: Secondary | ICD-10-CM | POA: Diagnosis not present

## 2019-03-14 DIAGNOSIS — Z299 Encounter for prophylactic measures, unspecified: Secondary | ICD-10-CM | POA: Diagnosis not present

## 2019-03-14 DIAGNOSIS — E1165 Type 2 diabetes mellitus with hyperglycemia: Secondary | ICD-10-CM | POA: Diagnosis not present

## 2019-03-15 DIAGNOSIS — I10 Essential (primary) hypertension: Secondary | ICD-10-CM | POA: Diagnosis not present

## 2019-03-15 DIAGNOSIS — E7800 Pure hypercholesterolemia, unspecified: Secondary | ICD-10-CM | POA: Diagnosis not present

## 2019-04-21 ENCOUNTER — Other Ambulatory Visit (HOSPITAL_COMMUNITY): Payer: Self-pay | Admitting: Nephrology

## 2019-04-21 DIAGNOSIS — E211 Secondary hyperparathyroidism, not elsewhere classified: Secondary | ICD-10-CM | POA: Diagnosis not present

## 2019-04-21 DIAGNOSIS — N189 Chronic kidney disease, unspecified: Secondary | ICD-10-CM

## 2019-04-21 DIAGNOSIS — R809 Proteinuria, unspecified: Secondary | ICD-10-CM | POA: Diagnosis not present

## 2019-04-21 DIAGNOSIS — I129 Hypertensive chronic kidney disease with stage 1 through stage 4 chronic kidney disease, or unspecified chronic kidney disease: Secondary | ICD-10-CM | POA: Diagnosis not present

## 2019-04-21 DIAGNOSIS — E873 Alkalosis: Secondary | ICD-10-CM | POA: Diagnosis not present

## 2019-04-27 ENCOUNTER — Encounter (HOSPITAL_COMMUNITY): Payer: Self-pay

## 2019-04-27 ENCOUNTER — Ambulatory Visit (HOSPITAL_COMMUNITY): Admission: RE | Admit: 2019-04-27 | Payer: Medicare Other | Source: Ambulatory Visit

## 2019-04-27 DIAGNOSIS — E211 Secondary hyperparathyroidism, not elsewhere classified: Secondary | ICD-10-CM | POA: Diagnosis not present

## 2019-04-27 DIAGNOSIS — N189 Chronic kidney disease, unspecified: Secondary | ICD-10-CM | POA: Diagnosis not present

## 2019-04-27 DIAGNOSIS — Z79899 Other long term (current) drug therapy: Secondary | ICD-10-CM | POA: Diagnosis not present

## 2019-04-27 DIAGNOSIS — E1122 Type 2 diabetes mellitus with diabetic chronic kidney disease: Secondary | ICD-10-CM | POA: Diagnosis not present

## 2019-04-27 DIAGNOSIS — E1129 Type 2 diabetes mellitus with other diabetic kidney complication: Secondary | ICD-10-CM | POA: Diagnosis not present

## 2019-04-27 DIAGNOSIS — E873 Alkalosis: Secondary | ICD-10-CM | POA: Diagnosis not present

## 2019-04-27 DIAGNOSIS — I129 Hypertensive chronic kidney disease with stage 1 through stage 4 chronic kidney disease, or unspecified chronic kidney disease: Secondary | ICD-10-CM | POA: Diagnosis not present

## 2019-05-09 ENCOUNTER — Other Ambulatory Visit: Payer: Self-pay

## 2019-05-09 ENCOUNTER — Ambulatory Visit (HOSPITAL_COMMUNITY)
Admission: RE | Admit: 2019-05-09 | Discharge: 2019-05-09 | Disposition: A | Discharge status: Home / Self Care | Payer: Medicare Other | Source: Ambulatory Visit | Attending: Nephrology | Admitting: Nephrology

## 2019-05-09 DIAGNOSIS — N189 Chronic kidney disease, unspecified: Secondary | ICD-10-CM | POA: Insufficient documentation

## 2019-05-09 DIAGNOSIS — I129 Hypertensive chronic kidney disease with stage 1 through stage 4 chronic kidney disease, or unspecified chronic kidney disease: Secondary | ICD-10-CM | POA: Diagnosis not present

## 2019-05-29 DIAGNOSIS — R5383 Other fatigue: Secondary | ICD-10-CM | POA: Diagnosis not present

## 2019-05-29 DIAGNOSIS — E559 Vitamin D deficiency, unspecified: Secondary | ICD-10-CM | POA: Diagnosis not present

## 2019-05-29 DIAGNOSIS — E1142 Type 2 diabetes mellitus with diabetic polyneuropathy: Secondary | ICD-10-CM | POA: Diagnosis not present

## 2019-05-29 DIAGNOSIS — Z0000 Encounter for general adult medical examination without abnormal findings: Secondary | ICD-10-CM | POA: Diagnosis not present

## 2019-05-29 DIAGNOSIS — E1165 Type 2 diabetes mellitus with hyperglycemia: Secondary | ICD-10-CM | POA: Diagnosis not present

## 2019-05-29 DIAGNOSIS — I10 Essential (primary) hypertension: Secondary | ICD-10-CM | POA: Diagnosis not present

## 2019-05-29 DIAGNOSIS — N2581 Secondary hyperparathyroidism of renal origin: Secondary | ICD-10-CM | POA: Diagnosis not present

## 2019-05-29 DIAGNOSIS — Z299 Encounter for prophylactic measures, unspecified: Secondary | ICD-10-CM | POA: Diagnosis not present

## 2019-05-29 DIAGNOSIS — Z1211 Encounter for screening for malignant neoplasm of colon: Secondary | ICD-10-CM | POA: Diagnosis not present

## 2019-05-29 DIAGNOSIS — Z7189 Other specified counseling: Secondary | ICD-10-CM | POA: Diagnosis not present

## 2019-05-29 DIAGNOSIS — M549 Dorsalgia, unspecified: Secondary | ICD-10-CM | POA: Diagnosis not present

## 2019-06-02 DIAGNOSIS — D472 Monoclonal gammopathy: Secondary | ICD-10-CM | POA: Diagnosis not present

## 2019-06-02 DIAGNOSIS — E211 Secondary hyperparathyroidism, not elsewhere classified: Secondary | ICD-10-CM | POA: Diagnosis not present

## 2019-06-02 DIAGNOSIS — N189 Chronic kidney disease, unspecified: Secondary | ICD-10-CM | POA: Diagnosis not present

## 2019-06-02 DIAGNOSIS — I129 Hypertensive chronic kidney disease with stage 1 through stage 4 chronic kidney disease, or unspecified chronic kidney disease: Secondary | ICD-10-CM | POA: Diagnosis not present

## 2019-06-02 DIAGNOSIS — E871 Hypo-osmolality and hyponatremia: Secondary | ICD-10-CM | POA: Diagnosis not present

## 2019-06-13 DIAGNOSIS — I10 Essential (primary) hypertension: Secondary | ICD-10-CM | POA: Diagnosis not present

## 2019-06-13 DIAGNOSIS — E1165 Type 2 diabetes mellitus with hyperglycemia: Secondary | ICD-10-CM | POA: Diagnosis not present

## 2019-06-13 DIAGNOSIS — Z299 Encounter for prophylactic measures, unspecified: Secondary | ICD-10-CM | POA: Diagnosis not present

## 2019-06-13 DIAGNOSIS — E1142 Type 2 diabetes mellitus with diabetic polyneuropathy: Secondary | ICD-10-CM | POA: Diagnosis not present

## 2019-06-13 DIAGNOSIS — E1122 Type 2 diabetes mellitus with diabetic chronic kidney disease: Secondary | ICD-10-CM | POA: Diagnosis not present

## 2019-06-18 DIAGNOSIS — E7800 Pure hypercholesterolemia, unspecified: Secondary | ICD-10-CM | POA: Diagnosis not present

## 2019-06-18 DIAGNOSIS — I10 Essential (primary) hypertension: Secondary | ICD-10-CM | POA: Diagnosis not present

## 2019-07-19 DIAGNOSIS — E7800 Pure hypercholesterolemia, unspecified: Secondary | ICD-10-CM | POA: Diagnosis not present

## 2019-07-19 DIAGNOSIS — I10 Essential (primary) hypertension: Secondary | ICD-10-CM | POA: Diagnosis not present

## 2019-08-18 DIAGNOSIS — E7800 Pure hypercholesterolemia, unspecified: Secondary | ICD-10-CM | POA: Diagnosis not present

## 2019-08-18 DIAGNOSIS — I10 Essential (primary) hypertension: Secondary | ICD-10-CM | POA: Diagnosis not present

## 2019-09-05 DIAGNOSIS — I10 Essential (primary) hypertension: Secondary | ICD-10-CM | POA: Diagnosis not present

## 2019-09-05 DIAGNOSIS — E7800 Pure hypercholesterolemia, unspecified: Secondary | ICD-10-CM | POA: Diagnosis not present

## 2019-09-06 DIAGNOSIS — E211 Secondary hyperparathyroidism, not elsewhere classified: Secondary | ICD-10-CM | POA: Diagnosis not present

## 2019-09-06 DIAGNOSIS — I129 Hypertensive chronic kidney disease with stage 1 through stage 4 chronic kidney disease, or unspecified chronic kidney disease: Secondary | ICD-10-CM | POA: Diagnosis not present

## 2019-09-06 DIAGNOSIS — N189 Chronic kidney disease, unspecified: Secondary | ICD-10-CM | POA: Diagnosis not present

## 2019-09-06 DIAGNOSIS — E1122 Type 2 diabetes mellitus with diabetic chronic kidney disease: Secondary | ICD-10-CM | POA: Diagnosis not present

## 2019-09-06 DIAGNOSIS — E871 Hypo-osmolality and hyponatremia: Secondary | ICD-10-CM | POA: Diagnosis not present

## 2019-09-19 DIAGNOSIS — I10 Essential (primary) hypertension: Secondary | ICD-10-CM | POA: Diagnosis not present

## 2019-09-19 DIAGNOSIS — E1122 Type 2 diabetes mellitus with diabetic chronic kidney disease: Secondary | ICD-10-CM | POA: Diagnosis not present

## 2019-09-19 DIAGNOSIS — N183 Chronic kidney disease, stage 3 unspecified: Secondary | ICD-10-CM | POA: Diagnosis not present

## 2019-09-19 DIAGNOSIS — Z299 Encounter for prophylactic measures, unspecified: Secondary | ICD-10-CM | POA: Diagnosis not present

## 2019-09-19 DIAGNOSIS — L4050 Arthropathic psoriasis, unspecified: Secondary | ICD-10-CM | POA: Diagnosis not present

## 2019-09-19 DIAGNOSIS — E1165 Type 2 diabetes mellitus with hyperglycemia: Secondary | ICD-10-CM | POA: Diagnosis not present

## 2019-09-20 DIAGNOSIS — N189 Chronic kidney disease, unspecified: Secondary | ICD-10-CM | POA: Diagnosis not present

## 2019-09-20 DIAGNOSIS — N170 Acute kidney failure with tubular necrosis: Secondary | ICD-10-CM | POA: Diagnosis not present

## 2019-09-20 DIAGNOSIS — E211 Secondary hyperparathyroidism, not elsewhere classified: Secondary | ICD-10-CM | POA: Diagnosis not present

## 2019-09-20 DIAGNOSIS — I129 Hypertensive chronic kidney disease with stage 1 through stage 4 chronic kidney disease, or unspecified chronic kidney disease: Secondary | ICD-10-CM | POA: Diagnosis not present

## 2019-09-20 DIAGNOSIS — E871 Hypo-osmolality and hyponatremia: Secondary | ICD-10-CM | POA: Diagnosis not present

## 2019-10-10 DIAGNOSIS — N170 Acute kidney failure with tubular necrosis: Secondary | ICD-10-CM | POA: Diagnosis not present

## 2019-10-10 DIAGNOSIS — N189 Chronic kidney disease, unspecified: Secondary | ICD-10-CM | POA: Diagnosis not present

## 2019-10-10 DIAGNOSIS — E1122 Type 2 diabetes mellitus with diabetic chronic kidney disease: Secondary | ICD-10-CM | POA: Diagnosis not present

## 2019-10-10 DIAGNOSIS — I129 Hypertensive chronic kidney disease with stage 1 through stage 4 chronic kidney disease, or unspecified chronic kidney disease: Secondary | ICD-10-CM | POA: Diagnosis not present

## 2019-10-10 DIAGNOSIS — E871 Hypo-osmolality and hyponatremia: Secondary | ICD-10-CM | POA: Diagnosis not present

## 2019-10-12 DIAGNOSIS — E1165 Type 2 diabetes mellitus with hyperglycemia: Secondary | ICD-10-CM | POA: Diagnosis not present

## 2019-10-12 DIAGNOSIS — E1122 Type 2 diabetes mellitus with diabetic chronic kidney disease: Secondary | ICD-10-CM | POA: Diagnosis not present

## 2019-10-12 DIAGNOSIS — R202 Paresthesia of skin: Secondary | ICD-10-CM | POA: Diagnosis not present

## 2019-10-12 DIAGNOSIS — R200 Anesthesia of skin: Secondary | ICD-10-CM | POA: Diagnosis not present

## 2019-10-12 DIAGNOSIS — Z299 Encounter for prophylactic measures, unspecified: Secondary | ICD-10-CM | POA: Diagnosis not present

## 2019-10-18 DIAGNOSIS — R809 Proteinuria, unspecified: Secondary | ICD-10-CM | POA: Diagnosis not present

## 2019-10-18 DIAGNOSIS — R600 Localized edema: Secondary | ICD-10-CM | POA: Diagnosis not present

## 2019-10-18 DIAGNOSIS — N189 Chronic kidney disease, unspecified: Secondary | ICD-10-CM | POA: Diagnosis not present

## 2019-10-18 DIAGNOSIS — N170 Acute kidney failure with tubular necrosis: Secondary | ICD-10-CM | POA: Diagnosis not present

## 2019-10-18 DIAGNOSIS — I129 Hypertensive chronic kidney disease with stage 1 through stage 4 chronic kidney disease, or unspecified chronic kidney disease: Secondary | ICD-10-CM | POA: Diagnosis not present

## 2019-10-23 DIAGNOSIS — M549 Dorsalgia, unspecified: Secondary | ICD-10-CM | POA: Diagnosis not present

## 2019-10-23 DIAGNOSIS — E1122 Type 2 diabetes mellitus with diabetic chronic kidney disease: Secondary | ICD-10-CM | POA: Diagnosis not present

## 2019-10-23 DIAGNOSIS — Z299 Encounter for prophylactic measures, unspecified: Secondary | ICD-10-CM | POA: Diagnosis not present

## 2019-10-23 DIAGNOSIS — K5909 Other constipation: Secondary | ICD-10-CM | POA: Diagnosis not present

## 2019-10-23 DIAGNOSIS — M545 Low back pain, unspecified: Secondary | ICD-10-CM | POA: Diagnosis not present

## 2019-10-23 DIAGNOSIS — M24852 Other specific joint derangements of left hip, not elsewhere classified: Secondary | ICD-10-CM | POA: Diagnosis not present

## 2019-10-23 DIAGNOSIS — M47816 Spondylosis without myelopathy or radiculopathy, lumbar region: Secondary | ICD-10-CM | POA: Diagnosis not present

## 2019-10-23 DIAGNOSIS — E1165 Type 2 diabetes mellitus with hyperglycemia: Secondary | ICD-10-CM | POA: Diagnosis not present

## 2019-10-23 DIAGNOSIS — I10 Essential (primary) hypertension: Secondary | ICD-10-CM | POA: Diagnosis not present

## 2019-10-23 DIAGNOSIS — M1612 Unilateral primary osteoarthritis, left hip: Secondary | ICD-10-CM | POA: Diagnosis not present

## 2019-10-25 ENCOUNTER — Other Ambulatory Visit: Payer: Self-pay

## 2019-10-25 ENCOUNTER — Ambulatory Visit (HOSPITAL_COMMUNITY)
Admission: RE | Admit: 2019-10-25 | Discharge: 2019-10-25 | Disposition: A | Discharge status: Home / Self Care | Payer: Medicare Other | Source: Ambulatory Visit | Attending: Nephrology | Admitting: Nephrology

## 2019-10-25 ENCOUNTER — Other Ambulatory Visit (HOSPITAL_COMMUNITY): Payer: Self-pay | Admitting: Nephrology

## 2019-10-25 DIAGNOSIS — N170 Acute kidney failure with tubular necrosis: Secondary | ICD-10-CM | POA: Diagnosis not present

## 2019-10-25 DIAGNOSIS — I999 Unspecified disorder of circulatory system: Secondary | ICD-10-CM

## 2019-10-25 DIAGNOSIS — R809 Proteinuria, unspecified: Secondary | ICD-10-CM | POA: Diagnosis not present

## 2019-10-25 DIAGNOSIS — N19 Unspecified kidney failure: Secondary | ICD-10-CM | POA: Insufficient documentation

## 2019-10-25 DIAGNOSIS — I129 Hypertensive chronic kidney disease with stage 1 through stage 4 chronic kidney disease, or unspecified chronic kidney disease: Secondary | ICD-10-CM | POA: Diagnosis not present

## 2019-10-25 DIAGNOSIS — N189 Chronic kidney disease, unspecified: Secondary | ICD-10-CM | POA: Diagnosis not present

## 2019-10-25 DIAGNOSIS — E1122 Type 2 diabetes mellitus with diabetic chronic kidney disease: Secondary | ICD-10-CM | POA: Diagnosis not present

## 2019-11-03 DIAGNOSIS — N1832 Chronic kidney disease, stage 3b: Secondary | ICD-10-CM | POA: Diagnosis not present

## 2019-11-03 DIAGNOSIS — R7303 Prediabetes: Secondary | ICD-10-CM | POA: Diagnosis not present

## 2019-11-03 DIAGNOSIS — I129 Hypertensive chronic kidney disease with stage 1 through stage 4 chronic kidney disease, or unspecified chronic kidney disease: Secondary | ICD-10-CM | POA: Diagnosis not present

## 2019-11-03 DIAGNOSIS — E079 Disorder of thyroid, unspecified: Secondary | ICD-10-CM | POA: Diagnosis not present

## 2019-11-03 DIAGNOSIS — I4510 Unspecified right bundle-branch block: Secondary | ICD-10-CM | POA: Diagnosis not present

## 2019-11-03 DIAGNOSIS — R600 Localized edema: Secondary | ICD-10-CM | POA: Diagnosis not present

## 2019-11-09 DIAGNOSIS — N189 Chronic kidney disease, unspecified: Secondary | ICD-10-CM | POA: Diagnosis not present

## 2019-11-09 DIAGNOSIS — I129 Hypertensive chronic kidney disease with stage 1 through stage 4 chronic kidney disease, or unspecified chronic kidney disease: Secondary | ICD-10-CM | POA: Diagnosis not present

## 2019-11-09 DIAGNOSIS — E876 Hypokalemia: Secondary | ICD-10-CM | POA: Diagnosis not present

## 2019-11-09 DIAGNOSIS — N170 Acute kidney failure with tubular necrosis: Secondary | ICD-10-CM | POA: Diagnosis not present

## 2019-11-09 DIAGNOSIS — R600 Localized edema: Secondary | ICD-10-CM | POA: Diagnosis not present

## 2019-11-10 ENCOUNTER — Other Ambulatory Visit (HOSPITAL_COMMUNITY): Payer: Self-pay | Admitting: Nephrology

## 2019-11-10 DIAGNOSIS — I129 Hypertensive chronic kidney disease with stage 1 through stage 4 chronic kidney disease, or unspecified chronic kidney disease: Secondary | ICD-10-CM

## 2019-11-15 ENCOUNTER — Ambulatory Visit (HOSPITAL_COMMUNITY)
Admission: RE | Admit: 2019-11-15 | Discharge: 2019-11-15 | Disposition: A | Discharge status: Home / Self Care | Payer: Medicare Other | Source: Ambulatory Visit | Attending: Nephrology | Admitting: Nephrology

## 2019-11-15 ENCOUNTER — Other Ambulatory Visit: Payer: Self-pay

## 2019-11-15 DIAGNOSIS — I129 Hypertensive chronic kidney disease with stage 1 through stage 4 chronic kidney disease, or unspecified chronic kidney disease: Secondary | ICD-10-CM | POA: Diagnosis not present

## 2019-11-15 LAB — ECHOCARDIOGRAM COMPLETE
AR max vel: 1.99 cm2
AV Area VTI: 2.53 cm2
AV Area mean vel: 2.21 cm2
AV Mean grad: 5.4 mmHg
AV Peak grad: 12.2 mmHg
Ao pk vel: 1.75 m/s
Area-P 1/2: 3 cm2
S' Lateral: 2.13 cm

## 2019-11-15 NOTE — Progress Notes (Signed)
*  PRELIMINARY RESULTS* Echocardiogram 2D Echocardiogram has been performed.  Amber Randall 11/15/2019, 2:00 PM

## 2019-11-17 DIAGNOSIS — I10 Essential (primary) hypertension: Secondary | ICD-10-CM | POA: Diagnosis not present

## 2019-11-17 DIAGNOSIS — E7800 Pure hypercholesterolemia, unspecified: Secondary | ICD-10-CM | POA: Diagnosis not present

## 2019-11-23 DIAGNOSIS — Z299 Encounter for prophylactic measures, unspecified: Secondary | ICD-10-CM | POA: Diagnosis not present

## 2019-11-23 DIAGNOSIS — Z23 Encounter for immunization: Secondary | ICD-10-CM | POA: Diagnosis not present

## 2019-11-23 DIAGNOSIS — M549 Dorsalgia, unspecified: Secondary | ICD-10-CM | POA: Diagnosis not present

## 2019-11-23 DIAGNOSIS — I10 Essential (primary) hypertension: Secondary | ICD-10-CM | POA: Diagnosis not present

## 2019-11-23 DIAGNOSIS — R609 Edema, unspecified: Secondary | ICD-10-CM | POA: Diagnosis not present

## 2019-11-29 DIAGNOSIS — E1122 Type 2 diabetes mellitus with diabetic chronic kidney disease: Secondary | ICD-10-CM | POA: Diagnosis not present

## 2019-11-29 DIAGNOSIS — R0689 Other abnormalities of breathing: Secondary | ICD-10-CM | POA: Diagnosis not present

## 2019-11-29 DIAGNOSIS — M79606 Pain in leg, unspecified: Secondary | ICD-10-CM | POA: Diagnosis not present

## 2019-11-29 DIAGNOSIS — Z743 Need for continuous supervision: Secondary | ICD-10-CM | POA: Diagnosis not present

## 2019-11-29 DIAGNOSIS — E079 Disorder of thyroid, unspecified: Secondary | ICD-10-CM | POA: Diagnosis not present

## 2019-11-29 DIAGNOSIS — E039 Hypothyroidism, unspecified: Secondary | ICD-10-CM | POA: Diagnosis not present

## 2019-11-29 DIAGNOSIS — E785 Hyperlipidemia, unspecified: Secondary | ICD-10-CM | POA: Diagnosis not present

## 2019-11-29 DIAGNOSIS — R6889 Other general symptoms and signs: Secondary | ICD-10-CM | POA: Diagnosis not present

## 2019-11-29 DIAGNOSIS — E119 Type 2 diabetes mellitus without complications: Secondary | ICD-10-CM | POA: Diagnosis not present

## 2019-11-29 DIAGNOSIS — I10 Essential (primary) hypertension: Secondary | ICD-10-CM | POA: Diagnosis not present

## 2019-11-29 DIAGNOSIS — Z602 Problems related to living alone: Secondary | ICD-10-CM | POA: Diagnosis not present

## 2019-11-29 DIAGNOSIS — M6281 Muscle weakness (generalized): Secondary | ICD-10-CM | POA: Diagnosis not present

## 2019-11-29 DIAGNOSIS — L03116 Cellulitis of left lower limb: Secondary | ICD-10-CM | POA: Diagnosis not present

## 2019-11-29 DIAGNOSIS — L03115 Cellulitis of right lower limb: Secondary | ICD-10-CM | POA: Diagnosis not present

## 2019-11-29 DIAGNOSIS — E876 Hypokalemia: Secondary | ICD-10-CM | POA: Diagnosis not present

## 2019-11-29 DIAGNOSIS — N189 Chronic kidney disease, unspecified: Secondary | ICD-10-CM | POA: Diagnosis not present

## 2019-11-29 DIAGNOSIS — Z7982 Long term (current) use of aspirin: Secondary | ICD-10-CM | POA: Diagnosis not present

## 2019-11-29 DIAGNOSIS — R5381 Other malaise: Secondary | ICD-10-CM | POA: Diagnosis not present

## 2019-11-29 DIAGNOSIS — R410 Disorientation, unspecified: Secondary | ICD-10-CM | POA: Diagnosis not present

## 2019-11-29 DIAGNOSIS — J069 Acute upper respiratory infection, unspecified: Secondary | ICD-10-CM | POA: Diagnosis not present

## 2019-11-29 DIAGNOSIS — R2689 Other abnormalities of gait and mobility: Secondary | ICD-10-CM | POA: Diagnosis not present

## 2019-11-29 DIAGNOSIS — R059 Cough, unspecified: Secondary | ICD-10-CM | POA: Diagnosis not present

## 2019-11-29 DIAGNOSIS — I129 Hypertensive chronic kidney disease with stage 1 through stage 4 chronic kidney disease, or unspecified chronic kidney disease: Secondary | ICD-10-CM | POA: Diagnosis not present

## 2019-12-04 DIAGNOSIS — R2689 Other abnormalities of gait and mobility: Secondary | ICD-10-CM | POA: Diagnosis not present

## 2019-12-04 DIAGNOSIS — L03116 Cellulitis of left lower limb: Secondary | ICD-10-CM | POA: Diagnosis not present

## 2019-12-04 DIAGNOSIS — R809 Proteinuria, unspecified: Secondary | ICD-10-CM | POA: Diagnosis not present

## 2019-12-04 DIAGNOSIS — E1129 Type 2 diabetes mellitus with other diabetic kidney complication: Secondary | ICD-10-CM | POA: Diagnosis not present

## 2019-12-04 DIAGNOSIS — R609 Edema, unspecified: Secondary | ICD-10-CM | POA: Diagnosis not present

## 2019-12-04 DIAGNOSIS — M6281 Muscle weakness (generalized): Secondary | ICD-10-CM | POA: Diagnosis not present

## 2019-12-04 DIAGNOSIS — I872 Venous insufficiency (chronic) (peripheral): Secondary | ICD-10-CM | POA: Diagnosis not present

## 2019-12-04 DIAGNOSIS — E876 Hypokalemia: Secondary | ICD-10-CM | POA: Diagnosis not present

## 2019-12-04 DIAGNOSIS — N183 Chronic kidney disease, stage 3 unspecified: Secondary | ICD-10-CM | POA: Diagnosis not present

## 2019-12-04 DIAGNOSIS — E119 Type 2 diabetes mellitus without complications: Secondary | ICD-10-CM | POA: Diagnosis not present

## 2019-12-04 DIAGNOSIS — Z299 Encounter for prophylactic measures, unspecified: Secondary | ICD-10-CM | POA: Diagnosis not present

## 2019-12-04 DIAGNOSIS — I10 Essential (primary) hypertension: Secondary | ICD-10-CM | POA: Diagnosis not present

## 2019-12-04 DIAGNOSIS — E039 Hypothyroidism, unspecified: Secondary | ICD-10-CM | POA: Diagnosis not present

## 2019-12-04 DIAGNOSIS — E785 Hyperlipidemia, unspecified: Secondary | ICD-10-CM | POA: Diagnosis not present

## 2019-12-04 DIAGNOSIS — I739 Peripheral vascular disease, unspecified: Secondary | ICD-10-CM | POA: Diagnosis not present

## 2019-12-04 DIAGNOSIS — L03115 Cellulitis of right lower limb: Secondary | ICD-10-CM | POA: Diagnosis not present

## 2019-12-07 DIAGNOSIS — I872 Venous insufficiency (chronic) (peripheral): Secondary | ICD-10-CM | POA: Diagnosis not present

## 2019-12-07 DIAGNOSIS — E1129 Type 2 diabetes mellitus with other diabetic kidney complication: Secondary | ICD-10-CM | POA: Diagnosis not present

## 2019-12-07 DIAGNOSIS — Z299 Encounter for prophylactic measures, unspecified: Secondary | ICD-10-CM | POA: Diagnosis not present

## 2019-12-07 DIAGNOSIS — I10 Essential (primary) hypertension: Secondary | ICD-10-CM | POA: Diagnosis not present

## 2019-12-07 DIAGNOSIS — R609 Edema, unspecified: Secondary | ICD-10-CM | POA: Diagnosis not present

## 2019-12-07 DIAGNOSIS — R809 Proteinuria, unspecified: Secondary | ICD-10-CM | POA: Diagnosis not present

## 2019-12-21 DIAGNOSIS — N183 Chronic kidney disease, stage 3 unspecified: Secondary | ICD-10-CM | POA: Diagnosis not present

## 2019-12-21 DIAGNOSIS — I10 Essential (primary) hypertension: Secondary | ICD-10-CM | POA: Diagnosis not present

## 2019-12-21 DIAGNOSIS — Z299 Encounter for prophylactic measures, unspecified: Secondary | ICD-10-CM | POA: Diagnosis not present

## 2019-12-21 DIAGNOSIS — I872 Venous insufficiency (chronic) (peripheral): Secondary | ICD-10-CM | POA: Diagnosis not present

## 2019-12-21 DIAGNOSIS — I739 Peripheral vascular disease, unspecified: Secondary | ICD-10-CM | POA: Diagnosis not present

## 2019-12-23 DIAGNOSIS — N183 Chronic kidney disease, stage 3 unspecified: Secondary | ICD-10-CM | POA: Diagnosis not present

## 2019-12-23 DIAGNOSIS — E1142 Type 2 diabetes mellitus with diabetic polyneuropathy: Secondary | ICD-10-CM | POA: Diagnosis not present

## 2019-12-23 DIAGNOSIS — I129 Hypertensive chronic kidney disease with stage 1 through stage 4 chronic kidney disease, or unspecified chronic kidney disease: Secondary | ICD-10-CM | POA: Diagnosis not present

## 2019-12-23 DIAGNOSIS — E1122 Type 2 diabetes mellitus with diabetic chronic kidney disease: Secondary | ICD-10-CM | POA: Diagnosis not present

## 2019-12-23 DIAGNOSIS — I872 Venous insufficiency (chronic) (peripheral): Secondary | ICD-10-CM | POA: Diagnosis not present

## 2019-12-23 DIAGNOSIS — E876 Hypokalemia: Secondary | ICD-10-CM | POA: Diagnosis not present

## 2019-12-23 DIAGNOSIS — L4050 Arthropathic psoriasis, unspecified: Secondary | ICD-10-CM | POA: Diagnosis not present

## 2019-12-23 DIAGNOSIS — E1151 Type 2 diabetes mellitus with diabetic peripheral angiopathy without gangrene: Secondary | ICD-10-CM | POA: Diagnosis not present

## 2019-12-23 DIAGNOSIS — M47816 Spondylosis without myelopathy or radiculopathy, lumbar region: Secondary | ICD-10-CM | POA: Diagnosis not present

## 2019-12-23 DIAGNOSIS — E1165 Type 2 diabetes mellitus with hyperglycemia: Secondary | ICD-10-CM | POA: Diagnosis not present

## 2019-12-23 DIAGNOSIS — E785 Hyperlipidemia, unspecified: Secondary | ICD-10-CM | POA: Diagnosis not present

## 2019-12-25 DIAGNOSIS — E1151 Type 2 diabetes mellitus with diabetic peripheral angiopathy without gangrene: Secondary | ICD-10-CM | POA: Diagnosis not present

## 2019-12-25 DIAGNOSIS — M47816 Spondylosis without myelopathy or radiculopathy, lumbar region: Secondary | ICD-10-CM | POA: Diagnosis not present

## 2019-12-25 DIAGNOSIS — E1122 Type 2 diabetes mellitus with diabetic chronic kidney disease: Secondary | ICD-10-CM | POA: Diagnosis not present

## 2019-12-25 DIAGNOSIS — E876 Hypokalemia: Secondary | ICD-10-CM | POA: Diagnosis not present

## 2019-12-25 DIAGNOSIS — I129 Hypertensive chronic kidney disease with stage 1 through stage 4 chronic kidney disease, or unspecified chronic kidney disease: Secondary | ICD-10-CM | POA: Diagnosis not present

## 2019-12-25 DIAGNOSIS — L4050 Arthropathic psoriasis, unspecified: Secondary | ICD-10-CM | POA: Diagnosis not present

## 2019-12-25 DIAGNOSIS — I872 Venous insufficiency (chronic) (peripheral): Secondary | ICD-10-CM | POA: Diagnosis not present

## 2019-12-25 DIAGNOSIS — N183 Chronic kidney disease, stage 3 unspecified: Secondary | ICD-10-CM | POA: Diagnosis not present

## 2019-12-25 DIAGNOSIS — E785 Hyperlipidemia, unspecified: Secondary | ICD-10-CM | POA: Diagnosis not present

## 2019-12-25 DIAGNOSIS — E1165 Type 2 diabetes mellitus with hyperglycemia: Secondary | ICD-10-CM | POA: Diagnosis not present

## 2019-12-25 DIAGNOSIS — E1142 Type 2 diabetes mellitus with diabetic polyneuropathy: Secondary | ICD-10-CM | POA: Diagnosis not present

## 2019-12-26 DIAGNOSIS — E1142 Type 2 diabetes mellitus with diabetic polyneuropathy: Secondary | ICD-10-CM | POA: Diagnosis not present

## 2019-12-26 DIAGNOSIS — E1122 Type 2 diabetes mellitus with diabetic chronic kidney disease: Secondary | ICD-10-CM | POA: Diagnosis not present

## 2019-12-26 DIAGNOSIS — N183 Chronic kidney disease, stage 3 unspecified: Secondary | ICD-10-CM | POA: Diagnosis not present

## 2019-12-26 DIAGNOSIS — E785 Hyperlipidemia, unspecified: Secondary | ICD-10-CM | POA: Diagnosis not present

## 2019-12-26 DIAGNOSIS — M47816 Spondylosis without myelopathy or radiculopathy, lumbar region: Secondary | ICD-10-CM | POA: Diagnosis not present

## 2019-12-26 DIAGNOSIS — E876 Hypokalemia: Secondary | ICD-10-CM | POA: Diagnosis not present

## 2019-12-26 DIAGNOSIS — E1151 Type 2 diabetes mellitus with diabetic peripheral angiopathy without gangrene: Secondary | ICD-10-CM | POA: Diagnosis not present

## 2019-12-26 DIAGNOSIS — L4050 Arthropathic psoriasis, unspecified: Secondary | ICD-10-CM | POA: Diagnosis not present

## 2019-12-26 DIAGNOSIS — I872 Venous insufficiency (chronic) (peripheral): Secondary | ICD-10-CM | POA: Diagnosis not present

## 2019-12-26 DIAGNOSIS — I129 Hypertensive chronic kidney disease with stage 1 through stage 4 chronic kidney disease, or unspecified chronic kidney disease: Secondary | ICD-10-CM | POA: Diagnosis not present

## 2019-12-26 DIAGNOSIS — E1165 Type 2 diabetes mellitus with hyperglycemia: Secondary | ICD-10-CM | POA: Diagnosis not present

## 2019-12-28 DIAGNOSIS — I10 Essential (primary) hypertension: Secondary | ICD-10-CM | POA: Diagnosis not present

## 2019-12-28 DIAGNOSIS — R609 Edema, unspecified: Secondary | ICD-10-CM | POA: Diagnosis not present

## 2019-12-28 DIAGNOSIS — E876 Hypokalemia: Secondary | ICD-10-CM | POA: Diagnosis not present

## 2019-12-28 DIAGNOSIS — Z299 Encounter for prophylactic measures, unspecified: Secondary | ICD-10-CM | POA: Diagnosis not present

## 2019-12-28 DIAGNOSIS — E1165 Type 2 diabetes mellitus with hyperglycemia: Secondary | ICD-10-CM | POA: Diagnosis not present

## 2019-12-28 DIAGNOSIS — N183 Chronic kidney disease, stage 3 unspecified: Secondary | ICD-10-CM | POA: Diagnosis not present

## 2019-12-28 DIAGNOSIS — L4050 Arthropathic psoriasis, unspecified: Secondary | ICD-10-CM | POA: Diagnosis not present

## 2019-12-28 DIAGNOSIS — M47816 Spondylosis without myelopathy or radiculopathy, lumbar region: Secondary | ICD-10-CM | POA: Diagnosis not present

## 2019-12-28 DIAGNOSIS — I129 Hypertensive chronic kidney disease with stage 1 through stage 4 chronic kidney disease, or unspecified chronic kidney disease: Secondary | ICD-10-CM | POA: Diagnosis not present

## 2019-12-28 DIAGNOSIS — E119 Type 2 diabetes mellitus without complications: Secondary | ICD-10-CM | POA: Diagnosis not present

## 2019-12-28 DIAGNOSIS — E1151 Type 2 diabetes mellitus with diabetic peripheral angiopathy without gangrene: Secondary | ICD-10-CM | POA: Diagnosis not present

## 2019-12-28 DIAGNOSIS — E1142 Type 2 diabetes mellitus with diabetic polyneuropathy: Secondary | ICD-10-CM | POA: Diagnosis not present

## 2019-12-28 DIAGNOSIS — I872 Venous insufficiency (chronic) (peripheral): Secondary | ICD-10-CM | POA: Diagnosis not present

## 2019-12-28 DIAGNOSIS — E785 Hyperlipidemia, unspecified: Secondary | ICD-10-CM | POA: Diagnosis not present

## 2019-12-28 DIAGNOSIS — E1122 Type 2 diabetes mellitus with diabetic chronic kidney disease: Secondary | ICD-10-CM | POA: Diagnosis not present

## 2019-12-29 DIAGNOSIS — L4050 Arthropathic psoriasis, unspecified: Secondary | ICD-10-CM | POA: Diagnosis not present

## 2019-12-29 DIAGNOSIS — E1165 Type 2 diabetes mellitus with hyperglycemia: Secondary | ICD-10-CM | POA: Diagnosis not present

## 2019-12-29 DIAGNOSIS — I872 Venous insufficiency (chronic) (peripheral): Secondary | ICD-10-CM | POA: Diagnosis not present

## 2019-12-29 DIAGNOSIS — I129 Hypertensive chronic kidney disease with stage 1 through stage 4 chronic kidney disease, or unspecified chronic kidney disease: Secondary | ICD-10-CM | POA: Diagnosis not present

## 2019-12-29 DIAGNOSIS — E1142 Type 2 diabetes mellitus with diabetic polyneuropathy: Secondary | ICD-10-CM | POA: Diagnosis not present

## 2019-12-29 DIAGNOSIS — E1122 Type 2 diabetes mellitus with diabetic chronic kidney disease: Secondary | ICD-10-CM | POA: Diagnosis not present

## 2019-12-29 DIAGNOSIS — N183 Chronic kidney disease, stage 3 unspecified: Secondary | ICD-10-CM | POA: Diagnosis not present

## 2019-12-29 DIAGNOSIS — E785 Hyperlipidemia, unspecified: Secondary | ICD-10-CM | POA: Diagnosis not present

## 2019-12-29 DIAGNOSIS — E1151 Type 2 diabetes mellitus with diabetic peripheral angiopathy without gangrene: Secondary | ICD-10-CM | POA: Diagnosis not present

## 2019-12-29 DIAGNOSIS — E876 Hypokalemia: Secondary | ICD-10-CM | POA: Diagnosis not present

## 2019-12-29 DIAGNOSIS — M47816 Spondylosis without myelopathy or radiculopathy, lumbar region: Secondary | ICD-10-CM | POA: Diagnosis not present

## 2020-01-01 DIAGNOSIS — M47816 Spondylosis without myelopathy or radiculopathy, lumbar region: Secondary | ICD-10-CM | POA: Diagnosis not present

## 2020-01-01 DIAGNOSIS — I129 Hypertensive chronic kidney disease with stage 1 through stage 4 chronic kidney disease, or unspecified chronic kidney disease: Secondary | ICD-10-CM | POA: Diagnosis not present

## 2020-01-01 DIAGNOSIS — L4050 Arthropathic psoriasis, unspecified: Secondary | ICD-10-CM | POA: Diagnosis not present

## 2020-01-01 DIAGNOSIS — N183 Chronic kidney disease, stage 3 unspecified: Secondary | ICD-10-CM | POA: Diagnosis not present

## 2020-01-01 DIAGNOSIS — E1122 Type 2 diabetes mellitus with diabetic chronic kidney disease: Secondary | ICD-10-CM | POA: Diagnosis not present

## 2020-01-01 DIAGNOSIS — E876 Hypokalemia: Secondary | ICD-10-CM | POA: Diagnosis not present

## 2020-01-01 DIAGNOSIS — E1151 Type 2 diabetes mellitus with diabetic peripheral angiopathy without gangrene: Secondary | ICD-10-CM | POA: Diagnosis not present

## 2020-01-01 DIAGNOSIS — E1165 Type 2 diabetes mellitus with hyperglycemia: Secondary | ICD-10-CM | POA: Diagnosis not present

## 2020-01-01 DIAGNOSIS — E785 Hyperlipidemia, unspecified: Secondary | ICD-10-CM | POA: Diagnosis not present

## 2020-01-01 DIAGNOSIS — I872 Venous insufficiency (chronic) (peripheral): Secondary | ICD-10-CM | POA: Diagnosis not present

## 2020-01-01 DIAGNOSIS — E1142 Type 2 diabetes mellitus with diabetic polyneuropathy: Secondary | ICD-10-CM | POA: Diagnosis not present

## 2020-01-02 DIAGNOSIS — E1159 Type 2 diabetes mellitus with other circulatory complications: Secondary | ICD-10-CM | POA: Diagnosis not present

## 2020-01-02 DIAGNOSIS — M79605 Pain in left leg: Secondary | ICD-10-CM | POA: Diagnosis not present

## 2020-01-02 DIAGNOSIS — E1143 Type 2 diabetes mellitus with diabetic autonomic (poly)neuropathy: Secondary | ICD-10-CM | POA: Diagnosis not present

## 2020-01-03 DIAGNOSIS — I129 Hypertensive chronic kidney disease with stage 1 through stage 4 chronic kidney disease, or unspecified chronic kidney disease: Secondary | ICD-10-CM | POA: Diagnosis not present

## 2020-01-03 DIAGNOSIS — E785 Hyperlipidemia, unspecified: Secondary | ICD-10-CM | POA: Diagnosis not present

## 2020-01-03 DIAGNOSIS — E1122 Type 2 diabetes mellitus with diabetic chronic kidney disease: Secondary | ICD-10-CM | POA: Diagnosis not present

## 2020-01-03 DIAGNOSIS — E1165 Type 2 diabetes mellitus with hyperglycemia: Secondary | ICD-10-CM | POA: Diagnosis not present

## 2020-01-03 DIAGNOSIS — E1142 Type 2 diabetes mellitus with diabetic polyneuropathy: Secondary | ICD-10-CM | POA: Diagnosis not present

## 2020-01-03 DIAGNOSIS — E1151 Type 2 diabetes mellitus with diabetic peripheral angiopathy without gangrene: Secondary | ICD-10-CM | POA: Diagnosis not present

## 2020-01-03 DIAGNOSIS — E876 Hypokalemia: Secondary | ICD-10-CM | POA: Diagnosis not present

## 2020-01-03 DIAGNOSIS — I872 Venous insufficiency (chronic) (peripheral): Secondary | ICD-10-CM | POA: Diagnosis not present

## 2020-01-03 DIAGNOSIS — M47816 Spondylosis without myelopathy or radiculopathy, lumbar region: Secondary | ICD-10-CM | POA: Diagnosis not present

## 2020-01-03 DIAGNOSIS — L4050 Arthropathic psoriasis, unspecified: Secondary | ICD-10-CM | POA: Diagnosis not present

## 2020-01-03 DIAGNOSIS — N183 Chronic kidney disease, stage 3 unspecified: Secondary | ICD-10-CM | POA: Diagnosis not present

## 2020-01-09 DIAGNOSIS — E1142 Type 2 diabetes mellitus with diabetic polyneuropathy: Secondary | ICD-10-CM | POA: Diagnosis not present

## 2020-01-09 DIAGNOSIS — I872 Venous insufficiency (chronic) (peripheral): Secondary | ICD-10-CM | POA: Diagnosis not present

## 2020-01-09 DIAGNOSIS — L4050 Arthropathic psoriasis, unspecified: Secondary | ICD-10-CM | POA: Diagnosis not present

## 2020-01-09 DIAGNOSIS — E785 Hyperlipidemia, unspecified: Secondary | ICD-10-CM | POA: Diagnosis not present

## 2020-01-09 DIAGNOSIS — E1151 Type 2 diabetes mellitus with diabetic peripheral angiopathy without gangrene: Secondary | ICD-10-CM | POA: Diagnosis not present

## 2020-01-09 DIAGNOSIS — M47816 Spondylosis without myelopathy or radiculopathy, lumbar region: Secondary | ICD-10-CM | POA: Diagnosis not present

## 2020-01-09 DIAGNOSIS — I129 Hypertensive chronic kidney disease with stage 1 through stage 4 chronic kidney disease, or unspecified chronic kidney disease: Secondary | ICD-10-CM | POA: Diagnosis not present

## 2020-01-09 DIAGNOSIS — E1122 Type 2 diabetes mellitus with diabetic chronic kidney disease: Secondary | ICD-10-CM | POA: Diagnosis not present

## 2020-01-09 DIAGNOSIS — E1165 Type 2 diabetes mellitus with hyperglycemia: Secondary | ICD-10-CM | POA: Diagnosis not present

## 2020-01-09 DIAGNOSIS — E876 Hypokalemia: Secondary | ICD-10-CM | POA: Diagnosis not present

## 2020-01-09 DIAGNOSIS — N183 Chronic kidney disease, stage 3 unspecified: Secondary | ICD-10-CM | POA: Diagnosis not present

## 2020-01-11 DIAGNOSIS — E1142 Type 2 diabetes mellitus with diabetic polyneuropathy: Secondary | ICD-10-CM | POA: Diagnosis not present

## 2020-01-11 DIAGNOSIS — M47816 Spondylosis without myelopathy or radiculopathy, lumbar region: Secondary | ICD-10-CM | POA: Diagnosis not present

## 2020-01-11 DIAGNOSIS — E785 Hyperlipidemia, unspecified: Secondary | ICD-10-CM | POA: Diagnosis not present

## 2020-01-11 DIAGNOSIS — I129 Hypertensive chronic kidney disease with stage 1 through stage 4 chronic kidney disease, or unspecified chronic kidney disease: Secondary | ICD-10-CM | POA: Diagnosis not present

## 2020-01-11 DIAGNOSIS — I872 Venous insufficiency (chronic) (peripheral): Secondary | ICD-10-CM | POA: Diagnosis not present

## 2020-01-11 DIAGNOSIS — E876 Hypokalemia: Secondary | ICD-10-CM | POA: Diagnosis not present

## 2020-01-11 DIAGNOSIS — E1151 Type 2 diabetes mellitus with diabetic peripheral angiopathy without gangrene: Secondary | ICD-10-CM | POA: Diagnosis not present

## 2020-01-11 DIAGNOSIS — L4050 Arthropathic psoriasis, unspecified: Secondary | ICD-10-CM | POA: Diagnosis not present

## 2020-01-11 DIAGNOSIS — E1122 Type 2 diabetes mellitus with diabetic chronic kidney disease: Secondary | ICD-10-CM | POA: Diagnosis not present

## 2020-01-11 DIAGNOSIS — E1165 Type 2 diabetes mellitus with hyperglycemia: Secondary | ICD-10-CM | POA: Diagnosis not present

## 2020-01-11 DIAGNOSIS — N183 Chronic kidney disease, stage 3 unspecified: Secondary | ICD-10-CM | POA: Diagnosis not present

## 2020-01-16 DIAGNOSIS — I872 Venous insufficiency (chronic) (peripheral): Secondary | ICD-10-CM | POA: Diagnosis not present

## 2020-01-16 DIAGNOSIS — E1165 Type 2 diabetes mellitus with hyperglycemia: Secondary | ICD-10-CM | POA: Diagnosis not present

## 2020-01-16 DIAGNOSIS — N183 Chronic kidney disease, stage 3 unspecified: Secondary | ICD-10-CM | POA: Diagnosis not present

## 2020-01-16 DIAGNOSIS — E1142 Type 2 diabetes mellitus with diabetic polyneuropathy: Secondary | ICD-10-CM | POA: Diagnosis not present

## 2020-01-16 DIAGNOSIS — E1151 Type 2 diabetes mellitus with diabetic peripheral angiopathy without gangrene: Secondary | ICD-10-CM | POA: Diagnosis not present

## 2020-01-16 DIAGNOSIS — L4050 Arthropathic psoriasis, unspecified: Secondary | ICD-10-CM | POA: Diagnosis not present

## 2020-01-16 DIAGNOSIS — E876 Hypokalemia: Secondary | ICD-10-CM | POA: Diagnosis not present

## 2020-01-16 DIAGNOSIS — E785 Hyperlipidemia, unspecified: Secondary | ICD-10-CM | POA: Diagnosis not present

## 2020-01-16 DIAGNOSIS — E1122 Type 2 diabetes mellitus with diabetic chronic kidney disease: Secondary | ICD-10-CM | POA: Diagnosis not present

## 2020-01-16 DIAGNOSIS — M47816 Spondylosis without myelopathy or radiculopathy, lumbar region: Secondary | ICD-10-CM | POA: Diagnosis not present

## 2020-01-16 DIAGNOSIS — I129 Hypertensive chronic kidney disease with stage 1 through stage 4 chronic kidney disease, or unspecified chronic kidney disease: Secondary | ICD-10-CM | POA: Diagnosis not present

## 2020-01-17 DIAGNOSIS — I872 Venous insufficiency (chronic) (peripheral): Secondary | ICD-10-CM | POA: Diagnosis not present

## 2020-01-17 DIAGNOSIS — M47816 Spondylosis without myelopathy or radiculopathy, lumbar region: Secondary | ICD-10-CM | POA: Diagnosis not present

## 2020-01-17 DIAGNOSIS — I129 Hypertensive chronic kidney disease with stage 1 through stage 4 chronic kidney disease, or unspecified chronic kidney disease: Secondary | ICD-10-CM | POA: Diagnosis not present

## 2020-01-17 DIAGNOSIS — L4050 Arthropathic psoriasis, unspecified: Secondary | ICD-10-CM | POA: Diagnosis not present

## 2020-01-17 DIAGNOSIS — E1142 Type 2 diabetes mellitus with diabetic polyneuropathy: Secondary | ICD-10-CM | POA: Diagnosis not present

## 2020-01-17 DIAGNOSIS — E785 Hyperlipidemia, unspecified: Secondary | ICD-10-CM | POA: Diagnosis not present

## 2020-01-17 DIAGNOSIS — E1165 Type 2 diabetes mellitus with hyperglycemia: Secondary | ICD-10-CM | POA: Diagnosis not present

## 2020-01-17 DIAGNOSIS — N183 Chronic kidney disease, stage 3 unspecified: Secondary | ICD-10-CM | POA: Diagnosis not present

## 2020-01-17 DIAGNOSIS — E876 Hypokalemia: Secondary | ICD-10-CM | POA: Diagnosis not present

## 2020-01-17 DIAGNOSIS — E1151 Type 2 diabetes mellitus with diabetic peripheral angiopathy without gangrene: Secondary | ICD-10-CM | POA: Diagnosis not present

## 2020-01-17 DIAGNOSIS — E1122 Type 2 diabetes mellitus with diabetic chronic kidney disease: Secondary | ICD-10-CM | POA: Diagnosis not present

## 2020-01-18 DIAGNOSIS — I10 Essential (primary) hypertension: Secondary | ICD-10-CM | POA: Diagnosis not present

## 2020-01-18 DIAGNOSIS — E7800 Pure hypercholesterolemia, unspecified: Secondary | ICD-10-CM | POA: Diagnosis not present

## 2020-01-26 DIAGNOSIS — E1165 Type 2 diabetes mellitus with hyperglycemia: Secondary | ICD-10-CM | POA: Diagnosis not present

## 2020-01-26 DIAGNOSIS — Z789 Other specified health status: Secondary | ICD-10-CM | POA: Diagnosis not present

## 2020-01-26 DIAGNOSIS — L0390 Cellulitis, unspecified: Secondary | ICD-10-CM | POA: Diagnosis not present

## 2020-01-26 DIAGNOSIS — E1122 Type 2 diabetes mellitus with diabetic chronic kidney disease: Secondary | ICD-10-CM | POA: Diagnosis not present

## 2020-01-26 DIAGNOSIS — Z299 Encounter for prophylactic measures, unspecified: Secondary | ICD-10-CM | POA: Diagnosis not present

## 2020-01-26 DIAGNOSIS — I10 Essential (primary) hypertension: Secondary | ICD-10-CM | POA: Diagnosis not present

## 2020-02-29 DIAGNOSIS — L0390 Cellulitis, unspecified: Secondary | ICD-10-CM | POA: Diagnosis not present

## 2020-02-29 DIAGNOSIS — E1165 Type 2 diabetes mellitus with hyperglycemia: Secondary | ICD-10-CM | POA: Diagnosis not present

## 2020-02-29 DIAGNOSIS — R0602 Shortness of breath: Secondary | ICD-10-CM | POA: Diagnosis not present

## 2020-02-29 DIAGNOSIS — Z299 Encounter for prophylactic measures, unspecified: Secondary | ICD-10-CM | POA: Diagnosis not present

## 2020-02-29 DIAGNOSIS — Z789 Other specified health status: Secondary | ICD-10-CM | POA: Diagnosis not present

## 2020-02-29 DIAGNOSIS — I10 Essential (primary) hypertension: Secondary | ICD-10-CM | POA: Diagnosis not present

## 2020-03-04 DIAGNOSIS — R0602 Shortness of breath: Secondary | ICD-10-CM | POA: Diagnosis not present

## 2020-03-14 DIAGNOSIS — E1165 Type 2 diabetes mellitus with hyperglycemia: Secondary | ICD-10-CM | POA: Diagnosis not present

## 2020-03-14 DIAGNOSIS — Z299 Encounter for prophylactic measures, unspecified: Secondary | ICD-10-CM | POA: Diagnosis not present

## 2020-03-14 DIAGNOSIS — I10 Essential (primary) hypertension: Secondary | ICD-10-CM | POA: Diagnosis not present

## 2020-03-14 DIAGNOSIS — Z789 Other specified health status: Secondary | ICD-10-CM | POA: Diagnosis not present

## 2020-03-14 DIAGNOSIS — R609 Edema, unspecified: Secondary | ICD-10-CM | POA: Diagnosis not present

## 2020-03-14 DIAGNOSIS — E1122 Type 2 diabetes mellitus with diabetic chronic kidney disease: Secondary | ICD-10-CM | POA: Diagnosis not present

## 2020-03-18 DIAGNOSIS — N183 Chronic kidney disease, stage 3 unspecified: Secondary | ICD-10-CM | POA: Diagnosis not present

## 2020-03-18 DIAGNOSIS — E7800 Pure hypercholesterolemia, unspecified: Secondary | ICD-10-CM | POA: Diagnosis not present

## 2020-03-18 DIAGNOSIS — I10 Essential (primary) hypertension: Secondary | ICD-10-CM | POA: Diagnosis not present

## 2020-03-20 DIAGNOSIS — I10 Essential (primary) hypertension: Secondary | ICD-10-CM | POA: Diagnosis not present

## 2020-03-20 DIAGNOSIS — Z299 Encounter for prophylactic measures, unspecified: Secondary | ICD-10-CM | POA: Diagnosis not present

## 2020-03-20 DIAGNOSIS — M25552 Pain in left hip: Secondary | ICD-10-CM | POA: Diagnosis not present

## 2020-03-20 DIAGNOSIS — E1165 Type 2 diabetes mellitus with hyperglycemia: Secondary | ICD-10-CM | POA: Diagnosis not present

## 2020-03-20 DIAGNOSIS — R609 Edema, unspecified: Secondary | ICD-10-CM | POA: Diagnosis not present

## 2020-03-20 DIAGNOSIS — E1142 Type 2 diabetes mellitus with diabetic polyneuropathy: Secondary | ICD-10-CM | POA: Diagnosis not present

## 2020-03-26 DIAGNOSIS — M25552 Pain in left hip: Secondary | ICD-10-CM | POA: Diagnosis not present

## 2020-03-26 DIAGNOSIS — Z9181 History of falling: Secondary | ICD-10-CM | POA: Diagnosis not present

## 2020-04-26 DIAGNOSIS — H524 Presbyopia: Secondary | ICD-10-CM | POA: Diagnosis not present

## 2020-04-26 DIAGNOSIS — E1165 Type 2 diabetes mellitus with hyperglycemia: Secondary | ICD-10-CM | POA: Diagnosis not present

## 2020-04-26 DIAGNOSIS — R030 Elevated blood-pressure reading, without diagnosis of hypertension: Secondary | ICD-10-CM | POA: Diagnosis not present

## 2020-04-26 DIAGNOSIS — Z7189 Other specified counseling: Secondary | ICD-10-CM | POA: Diagnosis not present

## 2020-04-26 DIAGNOSIS — E039 Hypothyroidism, unspecified: Secondary | ICD-10-CM | POA: Diagnosis not present

## 2020-04-26 DIAGNOSIS — R5383 Other fatigue: Secondary | ICD-10-CM | POA: Diagnosis not present

## 2020-04-26 DIAGNOSIS — Z0000 Encounter for general adult medical examination without abnormal findings: Secondary | ICD-10-CM | POA: Diagnosis not present

## 2020-04-26 DIAGNOSIS — Z299 Encounter for prophylactic measures, unspecified: Secondary | ICD-10-CM | POA: Diagnosis not present

## 2020-05-18 DIAGNOSIS — E7800 Pure hypercholesterolemia, unspecified: Secondary | ICD-10-CM | POA: Diagnosis not present

## 2020-05-18 DIAGNOSIS — N183 Chronic kidney disease, stage 3 unspecified: Secondary | ICD-10-CM | POA: Diagnosis not present

## 2020-05-18 DIAGNOSIS — I10 Essential (primary) hypertension: Secondary | ICD-10-CM | POA: Diagnosis not present

## 2020-05-21 DIAGNOSIS — Z79899 Other long term (current) drug therapy: Secondary | ICD-10-CM | POA: Diagnosis not present

## 2020-05-21 DIAGNOSIS — E7800 Pure hypercholesterolemia, unspecified: Secondary | ICD-10-CM | POA: Diagnosis not present

## 2020-05-21 DIAGNOSIS — E039 Hypothyroidism, unspecified: Secondary | ICD-10-CM | POA: Diagnosis not present

## 2020-05-21 DIAGNOSIS — R5383 Other fatigue: Secondary | ICD-10-CM | POA: Diagnosis not present

## 2020-05-21 DIAGNOSIS — E559 Vitamin D deficiency, unspecified: Secondary | ICD-10-CM | POA: Diagnosis not present

## 2020-07-08 DIAGNOSIS — I10 Essential (primary) hypertension: Secondary | ICD-10-CM | POA: Diagnosis not present

## 2020-07-08 DIAGNOSIS — N390 Urinary tract infection, site not specified: Secondary | ICD-10-CM | POA: Diagnosis not present

## 2020-07-08 DIAGNOSIS — M549 Dorsalgia, unspecified: Secondary | ICD-10-CM | POA: Diagnosis not present

## 2020-07-08 DIAGNOSIS — Z299 Encounter for prophylactic measures, unspecified: Secondary | ICD-10-CM | POA: Diagnosis not present

## 2020-07-08 DIAGNOSIS — E1142 Type 2 diabetes mellitus with diabetic polyneuropathy: Secondary | ICD-10-CM | POA: Diagnosis not present

## 2020-07-18 DIAGNOSIS — E7800 Pure hypercholesterolemia, unspecified: Secondary | ICD-10-CM | POA: Diagnosis not present

## 2020-07-18 DIAGNOSIS — I10 Essential (primary) hypertension: Secondary | ICD-10-CM | POA: Diagnosis not present

## 2020-07-18 DIAGNOSIS — N183 Chronic kidney disease, stage 3 unspecified: Secondary | ICD-10-CM | POA: Diagnosis not present

## 2020-08-07 DIAGNOSIS — E1122 Type 2 diabetes mellitus with diabetic chronic kidney disease: Secondary | ICD-10-CM | POA: Diagnosis not present

## 2020-08-07 DIAGNOSIS — Z299 Encounter for prophylactic measures, unspecified: Secondary | ICD-10-CM | POA: Diagnosis not present

## 2020-08-07 DIAGNOSIS — I10 Essential (primary) hypertension: Secondary | ICD-10-CM | POA: Diagnosis not present

## 2020-08-07 DIAGNOSIS — E1165 Type 2 diabetes mellitus with hyperglycemia: Secondary | ICD-10-CM | POA: Diagnosis not present

## 2020-08-07 DIAGNOSIS — N183 Chronic kidney disease, stage 3 unspecified: Secondary | ICD-10-CM | POA: Diagnosis not present

## 2020-08-22 DIAGNOSIS — Z299 Encounter for prophylactic measures, unspecified: Secondary | ICD-10-CM | POA: Diagnosis not present

## 2020-08-22 DIAGNOSIS — I10 Essential (primary) hypertension: Secondary | ICD-10-CM | POA: Diagnosis not present

## 2020-08-22 DIAGNOSIS — K921 Melena: Secondary | ICD-10-CM | POA: Diagnosis not present

## 2020-08-22 DIAGNOSIS — M7062 Trochanteric bursitis, left hip: Secondary | ICD-10-CM | POA: Diagnosis not present

## 2020-09-03 DIAGNOSIS — K625 Hemorrhage of anus and rectum: Secondary | ICD-10-CM | POA: Diagnosis not present

## 2020-09-18 DIAGNOSIS — E7800 Pure hypercholesterolemia, unspecified: Secondary | ICD-10-CM | POA: Diagnosis not present

## 2020-09-18 DIAGNOSIS — N183 Chronic kidney disease, stage 3 unspecified: Secondary | ICD-10-CM | POA: Diagnosis not present

## 2020-09-18 DIAGNOSIS — I10 Essential (primary) hypertension: Secondary | ICD-10-CM | POA: Diagnosis not present

## 2020-09-24 DIAGNOSIS — I6529 Occlusion and stenosis of unspecified carotid artery: Secondary | ICD-10-CM | POA: Diagnosis not present

## 2020-09-24 DIAGNOSIS — A419 Sepsis, unspecified organism: Secondary | ICD-10-CM | POA: Diagnosis not present

## 2020-09-24 DIAGNOSIS — I2510 Atherosclerotic heart disease of native coronary artery without angina pectoris: Secondary | ICD-10-CM | POA: Diagnosis not present

## 2020-09-24 DIAGNOSIS — W19XXXA Unspecified fall, initial encounter: Secondary | ICD-10-CM | POA: Diagnosis not present

## 2020-09-24 DIAGNOSIS — N179 Acute kidney failure, unspecified: Secondary | ICD-10-CM | POA: Diagnosis not present

## 2020-09-24 DIAGNOSIS — R0902 Hypoxemia: Secondary | ICD-10-CM | POA: Diagnosis not present

## 2020-09-24 DIAGNOSIS — Z9981 Dependence on supplemental oxygen: Secondary | ICD-10-CM | POA: Diagnosis not present

## 2020-09-24 DIAGNOSIS — I700 Atherosclerosis of aorta: Secondary | ICD-10-CM | POA: Diagnosis not present

## 2020-09-24 DIAGNOSIS — K5730 Diverticulosis of large intestine without perforation or abscess without bleeding: Secondary | ICD-10-CM | POA: Diagnosis not present

## 2020-09-24 DIAGNOSIS — R404 Transient alteration of awareness: Secondary | ICD-10-CM | POA: Diagnosis not present

## 2020-09-24 DIAGNOSIS — E870 Hyperosmolality and hypernatremia: Secondary | ICD-10-CM | POA: Diagnosis not present

## 2020-09-24 DIAGNOSIS — Z66 Do not resuscitate: Secondary | ICD-10-CM | POA: Diagnosis not present

## 2020-09-24 DIAGNOSIS — I6782 Cerebral ischemia: Secondary | ICD-10-CM | POA: Diagnosis not present

## 2020-09-24 DIAGNOSIS — E871 Hypo-osmolality and hyponatremia: Secondary | ICD-10-CM | POA: Diagnosis not present

## 2020-09-24 DIAGNOSIS — J9602 Acute respiratory failure with hypercapnia: Secondary | ICD-10-CM | POA: Diagnosis not present

## 2020-09-24 DIAGNOSIS — I129 Hypertensive chronic kidney disease with stage 1 through stage 4 chronic kidney disease, or unspecified chronic kidney disease: Secondary | ICD-10-CM | POA: Diagnosis not present

## 2020-09-24 DIAGNOSIS — G319 Degenerative disease of nervous system, unspecified: Secondary | ICD-10-CM | POA: Diagnosis not present

## 2020-09-24 DIAGNOSIS — M160 Bilateral primary osteoarthritis of hip: Secondary | ICD-10-CM | POA: Diagnosis not present

## 2020-09-24 DIAGNOSIS — R4020 Unspecified coma: Secondary | ICD-10-CM | POA: Diagnosis not present

## 2020-09-24 DIAGNOSIS — Z743 Need for continuous supervision: Secondary | ICD-10-CM | POA: Diagnosis not present

## 2020-09-24 DIAGNOSIS — Z7982 Long term (current) use of aspirin: Secondary | ICD-10-CM | POA: Diagnosis not present

## 2020-09-24 DIAGNOSIS — Z4682 Encounter for fitting and adjustment of non-vascular catheter: Secondary | ICD-10-CM | POA: Diagnosis not present

## 2020-09-24 DIAGNOSIS — M6282 Rhabdomyolysis: Secondary | ICD-10-CM | POA: Diagnosis not present

## 2020-09-24 DIAGNOSIS — J9601 Acute respiratory failure with hypoxia: Secondary | ICD-10-CM | POA: Diagnosis not present

## 2020-09-24 DIAGNOSIS — E875 Hyperkalemia: Secondary | ICD-10-CM | POA: Diagnosis not present

## 2020-09-24 DIAGNOSIS — Z20822 Contact with and (suspected) exposure to covid-19: Secondary | ICD-10-CM | POA: Diagnosis not present

## 2020-09-24 DIAGNOSIS — E1122 Type 2 diabetes mellitus with diabetic chronic kidney disease: Secondary | ICD-10-CM | POA: Diagnosis not present

## 2020-09-24 DIAGNOSIS — N19 Unspecified kidney failure: Secondary | ICD-10-CM | POA: Diagnosis not present

## 2020-09-24 DIAGNOSIS — T68XXXA Hypothermia, initial encounter: Secondary | ICD-10-CM | POA: Diagnosis not present

## 2020-09-24 DIAGNOSIS — J9600 Acute respiratory failure, unspecified whether with hypoxia or hypercapnia: Secondary | ICD-10-CM | POA: Diagnosis not present

## 2020-09-24 DIAGNOSIS — N189 Chronic kidney disease, unspecified: Secondary | ICD-10-CM | POA: Diagnosis not present

## 2020-09-24 DIAGNOSIS — J984 Other disorders of lung: Secondary | ICD-10-CM | POA: Diagnosis not present

## 2020-09-24 DIAGNOSIS — E079 Disorder of thyroid, unspecified: Secondary | ICD-10-CM | POA: Diagnosis not present

## 2020-09-24 DIAGNOSIS — Z515 Encounter for palliative care: Secondary | ICD-10-CM | POA: Diagnosis not present

## 2020-09-24 DIAGNOSIS — K862 Cyst of pancreas: Secondary | ICD-10-CM | POA: Diagnosis not present

## 2020-09-24 DIAGNOSIS — J9811 Atelectasis: Secondary | ICD-10-CM | POA: Diagnosis not present

## 2020-09-24 DIAGNOSIS — R6889 Other general symptoms and signs: Secondary | ICD-10-CM | POA: Diagnosis not present

## 2020-10-19 DEATH — deceased

## 2022-01-04 IMAGING — US US RENAL
1 series · 14 of 25 positions shown · non-contrast
Comparison: Abdomen and pelvis CT dated 06/18/2018.

CLINICAL DATA: Chronic kidney disease.

EXAM:
RENAL / URINARY TRACT ULTRASOUND COMPLETE

[Series 1: us renal · 14 of 43 slices shown]
[im 1/43]
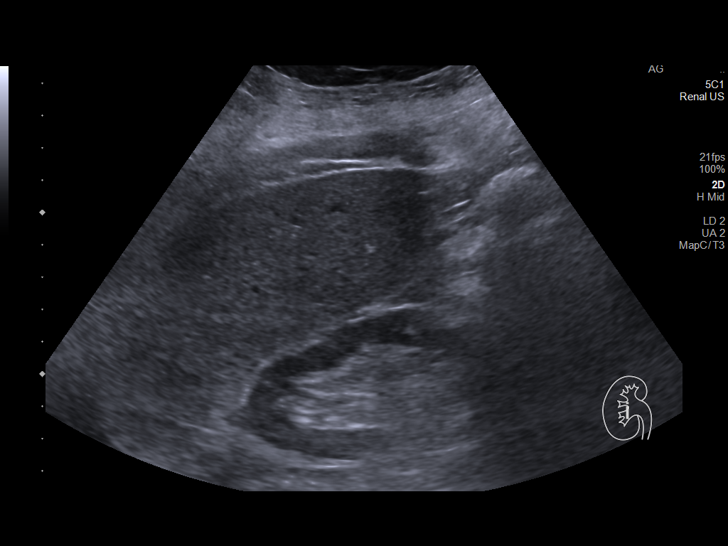
[im 4/43]
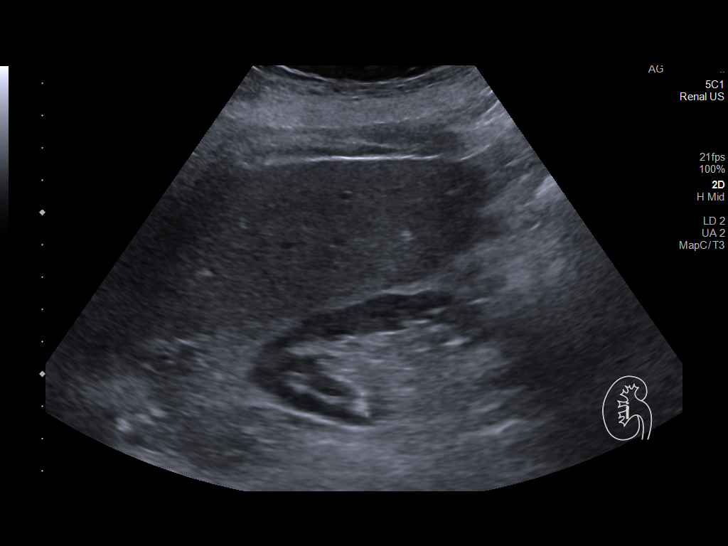
[im 8/43]
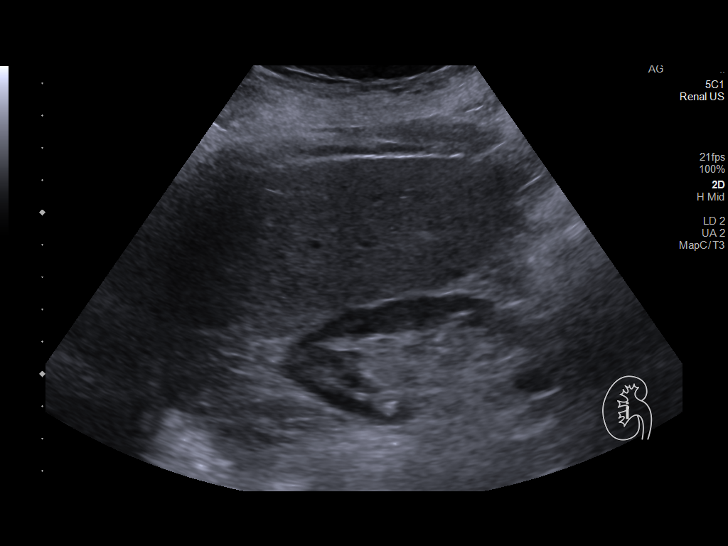
[im 11/43]
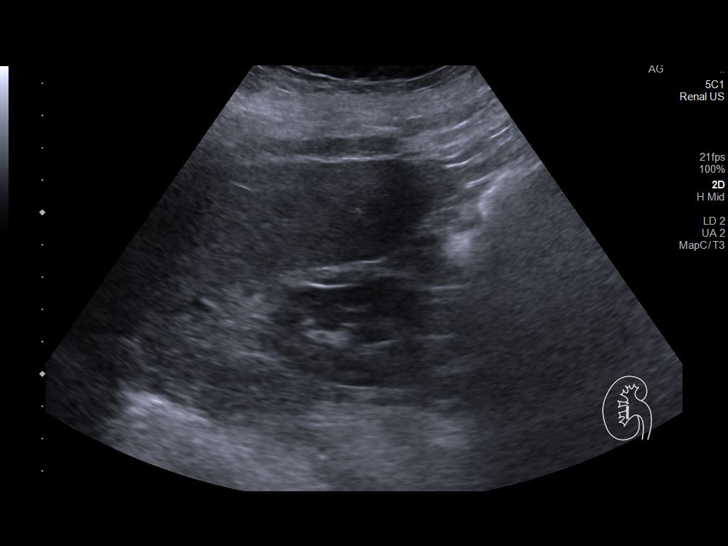
[im 15/43]
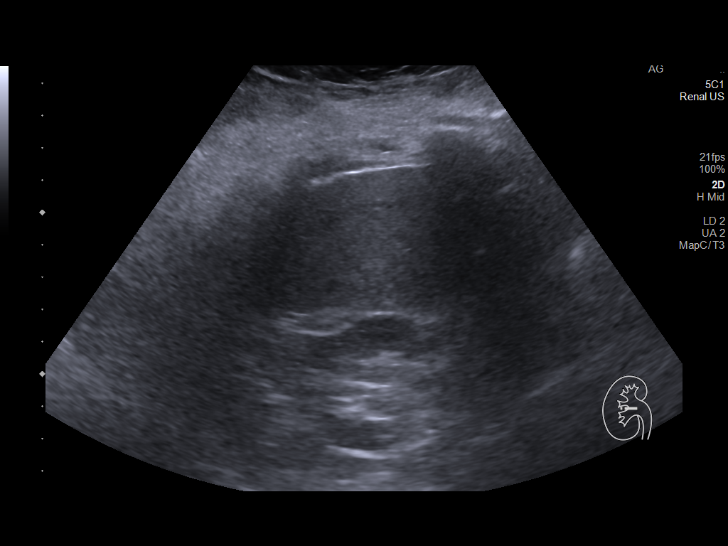
[im 16/43]
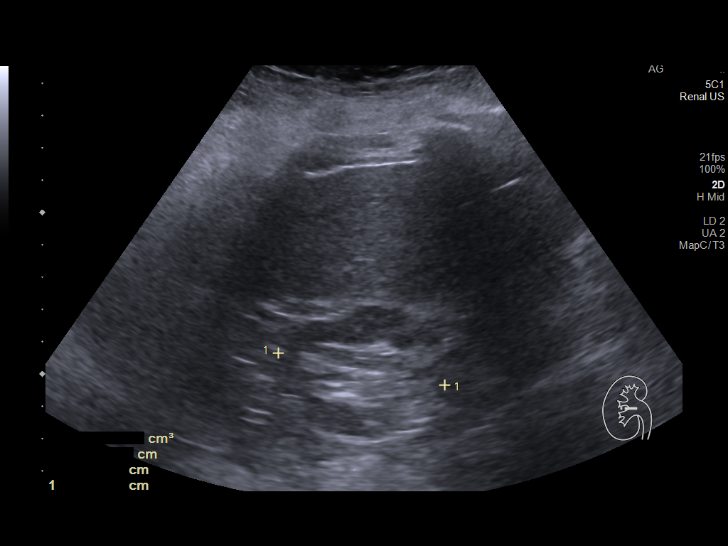
[im 20/43]
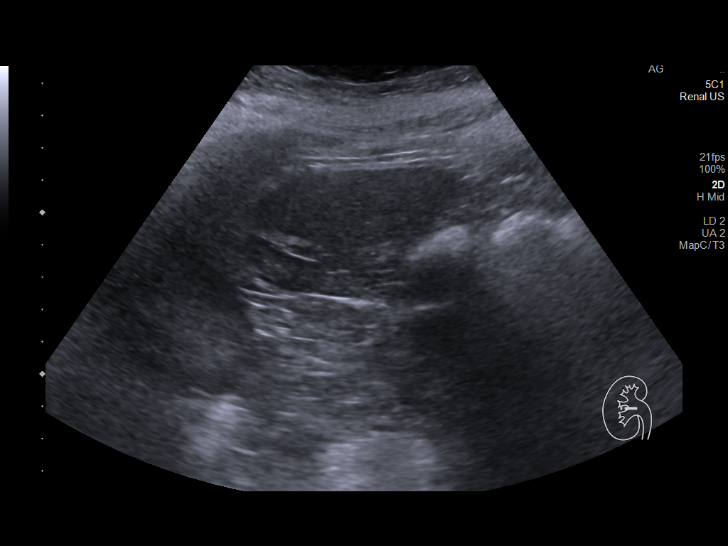
[im 23/43]
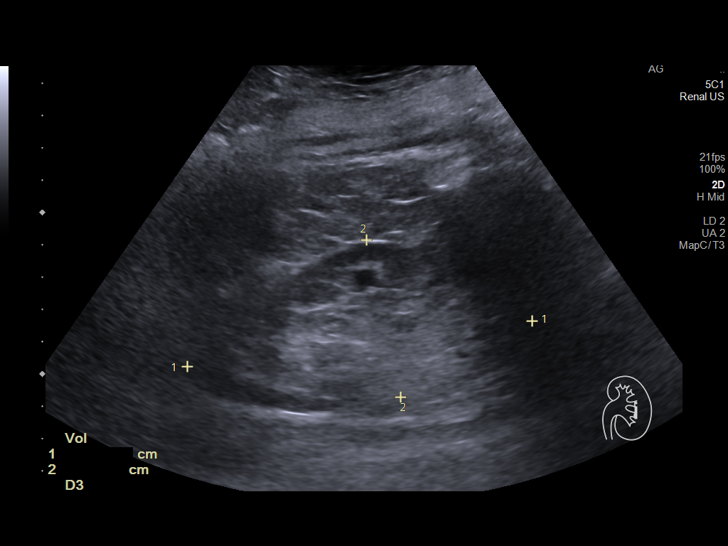
[im 27/43]
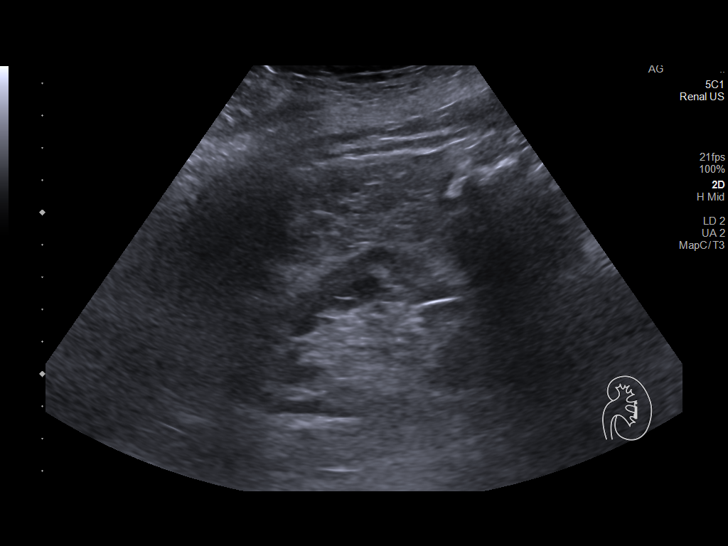
[im 29/43]
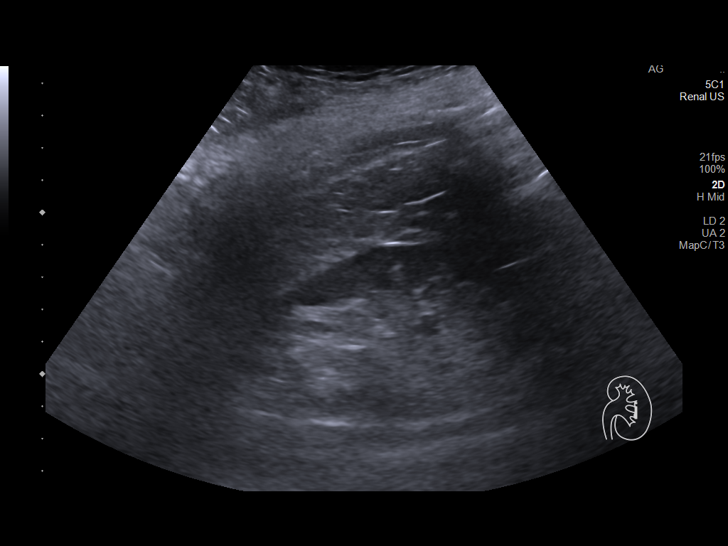
[im 32/43]
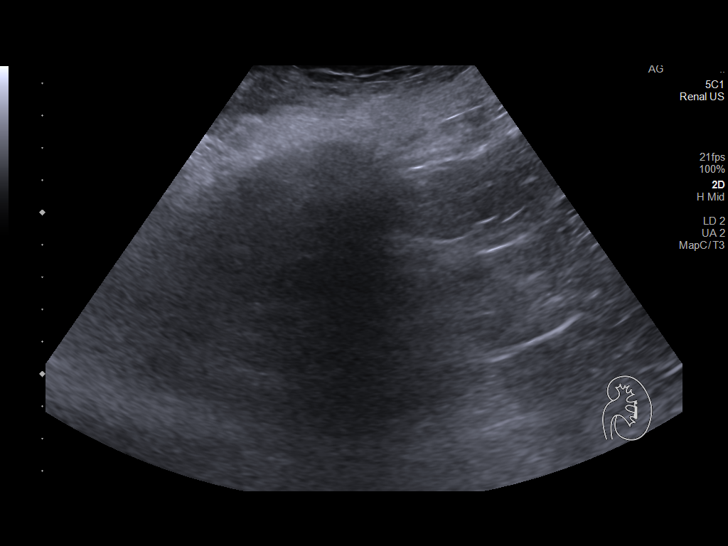
[im 36/43]
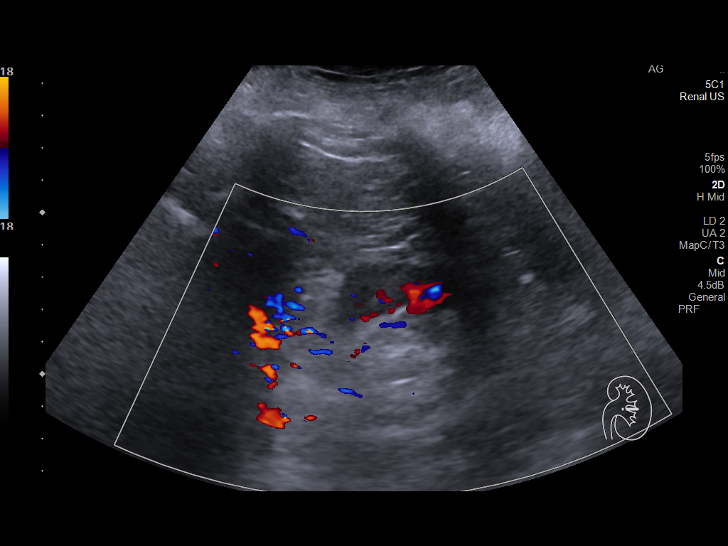
[im 39/43]
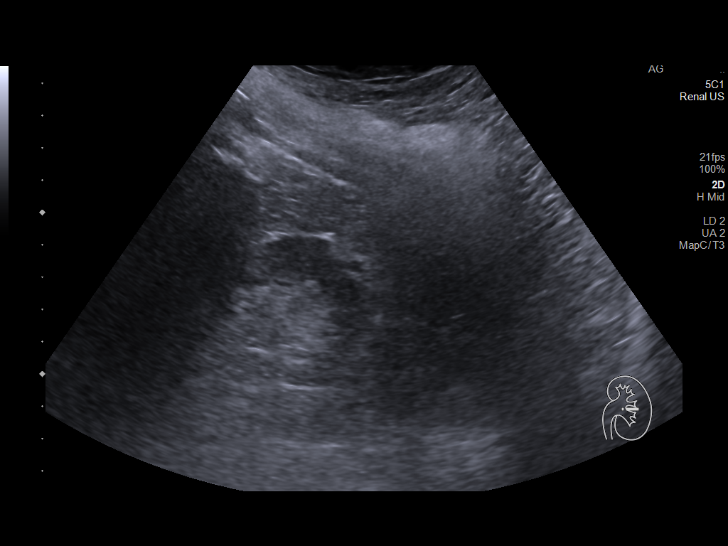
[im 43/43]
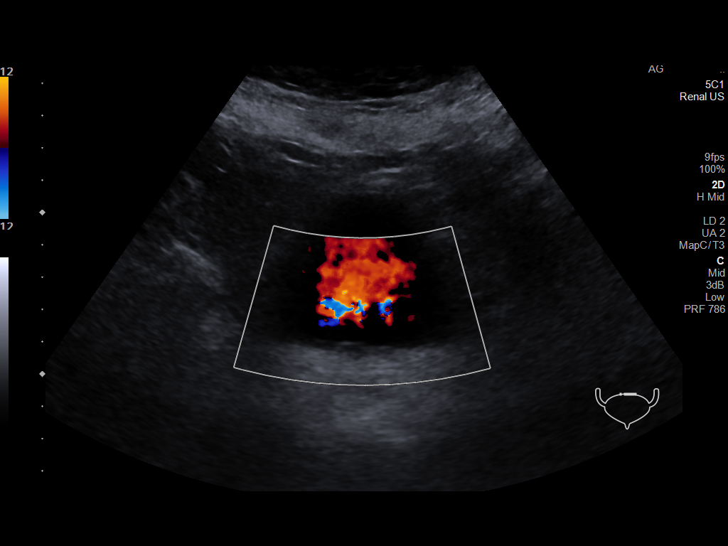

[14 of 25 positions shown; findings below may reference images not displayed]

FINDINGS: Right Kidney:

Renal measurements: 10.2 x 5.2 x 4.6 cm = volume: 128 mL .
Echogenicity within normal limits. No mass or hydronephrosis
visualized.

Left Kidney:

Renal measurements: 10.8 x 5.0 x 4.9 cm = volume: 137 mL.
Echogenicity within normal limits. No mass or hydronephrosis
visualized.

Bladder:

Appears normal for degree of bladder distention.

Other:

None.
IMPRESSION: Normal examination.
